# Patient Record
Sex: Male | Born: 1960
Health system: Southern US, Community
[De-identification: ages and names within clinical notes are randomized; demographics above are authoritative.]

## PROBLEM LIST (undated history)

## (undated) DIAGNOSIS — N400 Enlarged prostate without lower urinary tract symptoms: Secondary | ICD-10-CM

## (undated) DIAGNOSIS — E291 Testicular hypofunction: Secondary | ICD-10-CM

## (undated) DIAGNOSIS — Z87442 Personal history of urinary calculi: Secondary | ICD-10-CM

## (undated) DIAGNOSIS — R12 Heartburn: Secondary | ICD-10-CM

## (undated) DIAGNOSIS — F909 Attention-deficit hyperactivity disorder, unspecified type: Secondary | ICD-10-CM

## (undated) DIAGNOSIS — N480 Leukoplakia of penis: Secondary | ICD-10-CM

## (undated) DIAGNOSIS — F32A Depression, unspecified: Secondary | ICD-10-CM

## (undated) DIAGNOSIS — R7303 Prediabetes: Secondary | ICD-10-CM

## (undated) DIAGNOSIS — K219 Gastro-esophageal reflux disease without esophagitis: Secondary | ICD-10-CM

## (undated) DIAGNOSIS — F329 Major depressive disorder, single episode, unspecified: Secondary | ICD-10-CM

## (undated) DIAGNOSIS — N529 Male erectile dysfunction, unspecified: Secondary | ICD-10-CM

## (undated) DIAGNOSIS — F419 Anxiety disorder, unspecified: Secondary | ICD-10-CM

## (undated) HISTORY — DX: Heartburn: R12

## (undated) HISTORY — DX: Benign prostatic hyperplasia without lower urinary tract symptoms: N40.0

## (undated) HISTORY — DX: Depression, unspecified: F32.A

## (undated) HISTORY — PX: COLONOSCOPY: SHX174

## (undated) HISTORY — DX: Anxiety disorder, unspecified: F41.9

## (undated) HISTORY — PX: ESOPHAGOGASTRODUODENOSCOPY: SHX1529

## (undated) HISTORY — DX: Testicular hypofunction: E29.1

## (undated) HISTORY — PX: HERNIA REPAIR: SHX51

## (undated) HISTORY — DX: Major depressive disorder, single episode, unspecified: F32.9

## (undated) HISTORY — PX: CHOLECYSTECTOMY: SHX55

## (undated) HISTORY — DX: Leukoplakia of penis: N48.0

## (undated) HISTORY — DX: Male erectile dysfunction, unspecified: N52.9

## (undated) HISTORY — DX: Personal history of urinary calculi: Z87.442

---

## 2005-11-21 ENCOUNTER — Ambulatory Visit: Payer: Self-pay | Admitting: Urology

## 2006-04-15 ENCOUNTER — Ambulatory Visit: Payer: Self-pay | Admitting: Unknown Physician Specialty

## 2010-04-18 ENCOUNTER — Ambulatory Visit: Payer: Self-pay | Admitting: Internal Medicine

## 2010-04-23 ENCOUNTER — Ambulatory Visit: Payer: Self-pay | Admitting: Surgery

## 2010-05-14 ENCOUNTER — Ambulatory Visit: Payer: Self-pay | Admitting: Surgery

## 2010-05-15 LAB — PATHOLOGY REPORT

## 2011-10-14 ENCOUNTER — Other Ambulatory Visit: Payer: Self-pay | Admitting: Ophthalmology

## 2012-04-29 DIAGNOSIS — H169 Unspecified keratitis: Secondary | ICD-10-CM | POA: Insufficient documentation

## 2013-02-04 HISTORY — PX: CORNEAL TRANSPLANT: SHX108

## 2013-03-26 DIAGNOSIS — H179 Unspecified corneal scar and opacity: Secondary | ICD-10-CM | POA: Insufficient documentation

## 2015-05-16 ENCOUNTER — Other Ambulatory Visit: Payer: Self-pay | Admitting: Family Medicine

## 2015-12-13 DIAGNOSIS — F902 Attention-deficit hyperactivity disorder, combined type: Secondary | ICD-10-CM | POA: Diagnosis not present

## 2016-02-14 ENCOUNTER — Ambulatory Visit: Payer: Self-pay

## 2016-02-23 ENCOUNTER — Ambulatory Visit: Payer: Self-pay

## 2016-03-04 ENCOUNTER — Encounter: Payer: Self-pay | Admitting: *Deleted

## 2016-03-05 ENCOUNTER — Ambulatory Visit (INDEPENDENT_AMBULATORY_CARE_PROVIDER_SITE_OTHER): Payer: 59 | Admitting: Urology

## 2016-03-05 ENCOUNTER — Encounter: Payer: Self-pay | Admitting: Urology

## 2016-03-05 VITALS — BP 118/72 | HR 81 | Ht 76.0 in | Wt 196.7 lb

## 2016-03-05 DIAGNOSIS — N401 Enlarged prostate with lower urinary tract symptoms: Secondary | ICD-10-CM | POA: Diagnosis not present

## 2016-03-05 DIAGNOSIS — N529 Male erectile dysfunction, unspecified: Secondary | ICD-10-CM | POA: Insufficient documentation

## 2016-03-05 DIAGNOSIS — N528 Other male erectile dysfunction: Secondary | ICD-10-CM

## 2016-03-05 DIAGNOSIS — N400 Enlarged prostate without lower urinary tract symptoms: Secondary | ICD-10-CM | POA: Diagnosis not present

## 2016-03-05 DIAGNOSIS — N138 Other obstructive and reflux uropathy: Secondary | ICD-10-CM

## 2016-03-05 MED ORDER — TADALAFIL 5 MG PO TABS: 5.0000 mg | ORAL_TABLET | Freq: Every day | ORAL | Status: DC

## 2016-03-05 NOTE — Progress Notes (Signed)
03/05/2016 10:20 AM   Dustin Little [birth year] [id number]  Referring provider: No referring provider defined for this encounter.  Chief Complaint  Patient presents with  . Benign Prostatic Hypertrophy    1 year follow up    HPI: Patient is 55 year old Caucasian male with a history of ED and BPH with LUTS who presents today for his yearly follow up.    Erectile dysfunction His SHIM score is 19, which is mild ED.   He has been having difficulty with erections for over four years.   His major complaint is life stressors.  His libido is preserved.   His risk factors for ED are antidepressants, BPH, HLD and elevated HbgA1C .  He denies any painful erections or curvatures with his erections.   He has tried PDE5-inhibitors in the past with good success.          SHIM      03/05/16 1008       SHIM: Over the last 6 months:   How do you rate your confidence that you could get and keep an erection? Moderate     When you had erections with sexual stimulation, how often were your erections hard enough for penetration (entering your partner)? Most Times (much more than half the time)     During sexual intercourse, how often were you able to maintain your erection after you had penetrated (entered) your partner? Slightly Difficult     During sexual intercourse, how difficult was it to maintain your erection to completion of intercourse? Slightly Difficult     When you attempted sexual intercourse, how often was it satisfactory for you? Slightly Difficult     SHIM Total Score   SHIM 19        Score: 1-7 Severe ED 8-11 Moderate ED 12-16 Mild-Moderate ED 17-21 Mild ED 22-25 No ED   BPH WITH LUTS His IPSS score today is 1, which is mild lower urinary tract symptomatology. He is delighted with his quality life due to his urinary symptoms.  He denies any dysuria, hematuria or suprapubic pain.   He currently taking Cialis 5 mg daily.  He also denies any recent fevers, chills, nausea or  vomiting.  He does not have a family history of PCa.      IPSS      03/05/16 1000       International Prostate Symptom Score   How often have you had the sensation of not emptying your bladder? Not at All     How often have you had to urinate less than every two hours? Not at All     How often have you found you stopped and started again several times when you urinated? Not at All     How often have you found it difficult to postpone urination? Not at All     How often have you had a weak urinary stream? Not at All     How often have you had to strain to start urination? Not at All     How many times did you typically get up at night to urinate? 1 Time     Total IPSS Score 1     Quality of Life due to urinary symptoms   If you were to spend the rest of your life with your urinary condition just the way it is now how would you feel about that? Delighted        Score:  1-7 Mild  8-19 Moderate 20-35 Severe   PMH: Past Medical History  Diagnosis Date  . History of kidney stones   . Heartburn   . Anxiety   . Depression   . Hypogonadism in male   . ED (erectile dysfunction)   . BXO (balanitis xerotica obliterans)   . BPH (benign prostatic hypertrophy)     Surgical History: Past Surgical History  Procedure Laterality Date  . Corneal transplant  02/04/2013  . Hernia repair    . Cholecystectomy      Home Medications:    Medication List       This list is accurate as of: 03/05/16 10:20 AM.  Always use your most recent med list.               amphetamine-dextroamphetamine 15 MG 24 hr capsule  Commonly known as:  ADDERALL XR     buPROPion 300 MG 24 hr tablet  Commonly known as:  WELLBUTRIN XL  Take by mouth.     fluorometholone 0.1 % ophthalmic suspension  Commonly known as:  FML  Apply to eye.     tadalafil 5 MG tablet  Commonly known as:  CIALIS  Take 1 tablet (5 mg total) by mouth daily.        Allergies:  Allergies  Allergen Reactions  .  Orphenadrine Citrate Other (See Comments)    Blurred vision extremely    Family History: Family History  Problem Relation Age of Onset  . Cancer    . Kidney disease Neg Hx   . Prostate cancer Neg Hx     Social History:  reports that he has never smoked. He does not have any smokeless tobacco history on file. He reports that he drinks alcohol. He reports that he does not use illicit drugs.  ROS: UROLOGY Frequent Urination?: No Hard to postpone urination?: No Burning/pain with urination?: No Get up at night to urinate?: No Leakage of urine?: No Urine stream starts and stops?: No Trouble starting stream?: No Do you have to strain to urinate?: No Blood in urine?: No Urinary tract infection?: No Sexually transmitted disease?: No Injury to kidneys or bladder?: No Painful intercourse?: No Weak stream?: No Erection problems?: No Penile pain?: No  Gastrointestinal Nausea?: No Vomiting?: No Indigestion/heartburn?: Yes Diarrhea?: No Constipation?: No  Constitutional Fever: No Night sweats?: No Weight loss?: No Fatigue?: No  Skin Skin rash/lesions?: No Itching?: No  Eyes Blurred vision?: No Double vision?: No  Ears/Nose/Throat Sore throat?: No Sinus problems?: No  Hematologic/Lymphatic Swollen glands?: No Easy bruising?: No  Cardiovascular Leg swelling?: No Chest pain?: No  Respiratory Cough?: No Shortness of breath?: No  Endocrine Excessive thirst?: No  Musculoskeletal Back pain?: No Joint pain?: No  Neurological Headaches?: No Dizziness?: No  Psychologic Depression?: No Anxiety?: No  Physical Exam: BP 118/72 mmHg  Pulse 81  Ht 6\' 4"  (1.93 m)  Wt 196 lb 11.2 oz (89.223 kg)  BMI 23.95 kg/m2  Constitutional: Well nourished. Alert and oriented, No acute distress. HEENT: [place] AT, moist mucus membranes. Trachea midline, no masses. Cardiovascular: No clubbing, cyanosis, or edema. Respiratory: Normal respiratory effort, no increased work of  breathing. GI: Abdomen is soft, non tender, non distended, no abdominal masses. Liver and spleen not palpable.  No hernias appreciated.  Stool sample for occult testing is not indicated.   GU: No CVA tenderness.  No bladder fullness or masses.  Patient with circumcised phallus.  Urethral meatus is patent.  No penile discharge. No penile lesions or rashes.  Scrotum without lesions, cysts, rashes and/or edema.  Testicles are located scrotally bilaterally. No masses are appreciated in the testicles. Left and right epididymis are normal.  Left varicoceles are noted.   Rectal: Patient with  normal sphincter tone. Anus and perineum without scarring or rashes. No rectal masses are appreciated. Prostate is approximately 45 grams, no nodules are appreciated. Seminal vesicles are normal. Skin: No rashes, bruises or suspicious lesions. Lymph: No cervical or inguinal adenopathy. Neurologic: Grossly intact, no focal deficits, moving all 4 extremities. Psychiatric: Normal mood and affect.  Laboratory Data: PSA History  0.8 ng/mL on 02/10/2013  0.5 ng/mL on 02/10/2014  0.7 ng/mL on 02/14/2015   Assessment & Plan:    1. Erectile dysfunction:   SHIM score is 19.  Continue Cialis.  We will continue to monitor.  He will RTC in one year for SHIM score and exam.    2. BPH (benign prostatic hyperplasia) with LUTS:   IPSS score is 1/0.  He will continue the Cialis 5 mg daily.  He will have his IPSS score, exam and PSA in 12 months.   - PSA   Return in about 1 year (around 03/05/2017) for IPSS, SHIM and exam.  These notes generated with voice recognition software. I apologize for typographical errors.  Zara Council, [facility] [address] [place], [place] [address] [phone]

## 2016-03-06 LAB — PSA: Prostate Specific Ag, Serum: 0.5 ng/mL (ref 0.0–4.0)

## 2016-03-07 ENCOUNTER — Telehealth: Payer: Self-pay

## 2016-03-07 NOTE — Telephone Encounter (Signed)
Spoke with pt in reference to PSA results. Pt voiced understanding.  

## 2016-03-07 NOTE — Telephone Encounter (Signed)
-----   Message from Nori Riis, PA-C sent at 03/06/2016 12:57 PM EDT ----- Patient's PSA is stable.  We will see him in one year.

## 2016-04-25 DIAGNOSIS — F902 Attention-deficit hyperactivity disorder, combined type: Secondary | ICD-10-CM | POA: Diagnosis not present

## 2016-05-10 DIAGNOSIS — K219 Gastro-esophageal reflux disease without esophagitis: Secondary | ICD-10-CM | POA: Insufficient documentation

## 2016-05-10 DIAGNOSIS — Z8659 Personal history of other mental and behavioral disorders: Secondary | ICD-10-CM | POA: Insufficient documentation

## 2016-05-10 DIAGNOSIS — Z131 Encounter for screening for diabetes mellitus: Secondary | ICD-10-CM | POA: Diagnosis not present

## 2016-05-10 DIAGNOSIS — K449 Diaphragmatic hernia without obstruction or gangrene: Secondary | ICD-10-CM | POA: Diagnosis not present

## 2016-05-10 DIAGNOSIS — F325 Major depressive disorder, single episode, in full remission: Secondary | ICD-10-CM | POA: Diagnosis not present

## 2016-05-10 DIAGNOSIS — Z8639 Personal history of other endocrine, nutritional and metabolic disease: Secondary | ICD-10-CM | POA: Diagnosis not present

## 2016-05-10 DIAGNOSIS — Z1159 Encounter for screening for other viral diseases: Secondary | ICD-10-CM | POA: Diagnosis not present

## 2016-05-10 DIAGNOSIS — N529 Male erectile dysfunction, unspecified: Secondary | ICD-10-CM | POA: Diagnosis not present

## 2016-05-10 DIAGNOSIS — R972 Elevated prostate specific antigen [PSA]: Secondary | ICD-10-CM | POA: Diagnosis not present

## 2016-06-23 ENCOUNTER — Ambulatory Visit (INDEPENDENT_AMBULATORY_CARE_PROVIDER_SITE_OTHER): Payer: 59

## 2016-06-23 ENCOUNTER — Ambulatory Visit
Admission: EM | Admit: 2016-06-23 | Discharge: 2016-06-23 | Disposition: A | Discharge status: Home / Self Care | Payer: 59 | Attending: Family Medicine | Admitting: Family Medicine

## 2016-06-23 ENCOUNTER — Encounter: Payer: Self-pay | Admitting: Gynecology

## 2016-06-23 DIAGNOSIS — N200 Calculus of kidney: Secondary | ICD-10-CM

## 2016-06-23 DIAGNOSIS — R82998 Other abnormal findings in urine: Secondary | ICD-10-CM

## 2016-06-23 DIAGNOSIS — R8299 Other abnormal findings in urine: Secondary | ICD-10-CM

## 2016-06-23 DIAGNOSIS — R809 Proteinuria, unspecified: Secondary | ICD-10-CM

## 2016-06-23 DIAGNOSIS — R1031 Right lower quadrant pain: Secondary | ICD-10-CM | POA: Diagnosis not present

## 2016-06-23 LAB — CBC WITH DIFFERENTIAL/PLATELET
BASOS ABS: 0 10*3/uL (ref 0–0.1)
BASOS PCT: 1 %
EOS ABS: 0.4 10*3/uL (ref 0–0.7)
EOS PCT: 5 %
HCT: 44.1 % (ref 40.0–52.0)
Hemoglobin: 14.8 g/dL (ref 13.0–18.0)
Lymphocytes Relative: 19 %
Lymphs Abs: 1.8 10*3/uL (ref 1.0–3.6)
MCH: 30.6 pg (ref 26.0–34.0)
MCHC: 33.5 g/dL (ref 32.0–36.0)
MCV: 91.5 fL (ref 80.0–100.0)
Monocytes Absolute: 0.8 10*3/uL (ref 0.2–1.0)
Monocytes Relative: 8 %
NEUTROS PCT: 67 %
Neutro Abs: 6.3 10*3/uL (ref 1.4–6.5)
PLATELETS: 225 10*3/uL (ref 150–440)
RBC: 4.82 MIL/uL (ref 4.40–5.90)
RDW: 12.4 % (ref 11.5–14.5)
WBC: 9.3 10*3/uL (ref 3.8–10.6)

## 2016-06-23 LAB — COMPREHENSIVE METABOLIC PANEL
ALBUMIN: 4.7 g/dL (ref 3.5–5.0)
ALT: 17 U/L (ref 17–63)
AST: 17 U/L (ref 15–41)
Alkaline Phosphatase: 76 U/L (ref 38–126)
Anion gap: 7 (ref 5–15)
BUN: 15 mg/dL (ref 6–20)
CHLORIDE: 100 mmol/L — AB (ref 101–111)
CO2: 29 mmol/L (ref 22–32)
CREATININE: 1.03 mg/dL (ref 0.61–1.24)
Calcium: 9.1 mg/dL (ref 8.9–10.3)
GFR calc non Af Amer: >60 mL/min (ref >60)
GLUCOSE: 112 mg/dL — AB (ref 65–99)
Potassium: 3.6 mmol/L (ref 3.5–5.1)
SODIUM: 136 mmol/L (ref 135–145)
Total Bilirubin: 1 mg/dL (ref 0.3–1.2)
Total Protein: 7.6 g/dL (ref 6.5–8.1)

## 2016-06-23 LAB — URINALYSIS COMPLETE WITH MICROSCOPIC (ARMC ONLY)
Bilirubin Urine: NEGATIVE
Glucose, UA: NEGATIVE mg/dL
Ketones, ur: NEGATIVE mg/dL
LEUKOCYTES UA: NEGATIVE
Nitrite: NEGATIVE
PROTEIN: 30 mg/dL — AB
SPECIFIC GRAVITY, URINE: 1.015 (ref 1.005–1.030)
WBC, UA: NONE SEEN WBC/hpf (ref 0–5)
pH: 5.5 (ref 5.0–8.0)

## 2016-06-23 MED ORDER — OXYCODONE-ACETAMINOPHEN 5-325 MG PO TABS: 1.0000 | ORAL_TABLET | Freq: Four times a day (QID) | ORAL | 0 refills | Status: AC | PRN

## 2016-06-23 MED ORDER — ONDANSETRON 8 MG PO TBDP: 8.0000 mg | ORAL_TABLET | Freq: Two times a day (BID) | ORAL | 0 refills | Status: AC | PRN

## 2016-06-23 MED ORDER — KETOROLAC TROMETHAMINE 10 MG PO TABS: 10.0000 mg | ORAL_TABLET | Freq: Four times a day (QID) | ORAL | 0 refills | Status: AC | PRN

## 2016-06-23 MED ORDER — TAMSULOSIN HCL 0.4 MG PO CAPS: 0.4000 mg | ORAL_CAPSULE | Freq: Every day | ORAL | 0 refills | Status: AC

## 2016-06-23 MED ORDER — KETOROLAC TROMETHAMINE 60 MG/2ML IM SOLN
60.0000 mg | Freq: Once | INTRAMUSCULAR | Status: AC
  Administered 2016-06-23: 60 mg via INTRAMUSCULAR

## 2016-06-23 NOTE — ED Triage Notes (Signed)
Patient c/o sharp pain this morning at right lower quadrant and groin area.

## 2016-06-23 NOTE — ED Provider Notes (Signed)
CSN: [id number]     Arrival date & time 06/23/16  0945 History   First MD Initiated Contact with Patient 06/23/16 1005     Chief Complaint  Patient presents with  . Abdominal Pain   (Consider location/radiation/quality/duration/timing/severity/associated sxs/prior Treatment) Married caucasian male here with spouse as unable to urinate since yesterday.  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  Last kidney stone 15 years ago passed.  Saw Urology May 2016 for his BPH and ED.  Stated his urine has been darker this week and yesterday penis hurt with peeing or trying to pee.  Was awakened 0300 today with right inguinal lower abdomen pain nausea now resolved.  Ate light dinner last night egg noodles.  No breakfast this am.  Denied fevers, back pain.  PMHx  Hiatal hernia daily pain epigastric  PSHx right inguinal hernia repair tender today scar but not usually, cholecystectomy, corneal transplant   The history is provided by the patient and the spouse.    Past Medical History:  Diagnosis Date  . Anxiety   . BPH (benign prostatic hypertrophy)   . BXO (balanitis xerotica obliterans)   . Depression   . ED (erectile dysfunction)   . Heartburn   . History of kidney stones   . Hypogonadism in male    Past Surgical History:  Procedure Laterality Date  . CHOLECYSTECTOMY    . CORNEAL TRANSPLANT  02/04/2013  . HERNIA REPAIR     Family History  Problem Relation Age of Onset  . Cancer    . Kidney disease Neg Hx   . Prostate cancer Neg Hx    Social History  Substance Use Topics  . Smoking status: Never Smoker  . Smokeless tobacco: Never Used  . Alcohol use 0.0 oz/week     Comment: occ    Review of Systems  Constitutional: Positive for appetite change and diaphoresis. Negative for activity change, chills, fatigue and fever.  HENT: Negative for congestion, ear pain and sore throat.   Eyes: Negative for pain and visual disturbance.    Respiratory: Negative for cough and shortness of breath.   Cardiovascular: Negative for chest pain, palpitations and leg swelling.  Gastrointestinal: Positive for abdominal pain and nausea. Negative for abdominal distention, anal bleeding, blood in stool, constipation, diarrhea, rectal pain and vomiting.  Endocrine: Negative for cold intolerance and heat intolerance.  Genitourinary: Negative for dysuria and hematuria.  Musculoskeletal: Negative for arthralgias, back pain, gait problem, joint swelling, myalgias, neck pain and neck stiffness.  Skin: Negative for color change, pallor, rash and wound.  Allergic/Immunologic: Negative for environmental allergies and food allergies.  Neurological: Negative for dizziness, tremors, seizures, syncope, facial asymmetry, speech difficulty, weakness, light-headedness, numbness and headaches.  Hematological: Negative for adenopathy. Does not bruise/bleed easily.  Psychiatric/Behavioral: Positive for sleep disturbance.  All other systems reviewed and are negative.   Allergies  Orphenadrine citrate  Home Medications   Prior to Admission medications   Medication Sig Start Date End Date Taking? Authorizing Provider  amphetamine-dextroamphetamine (ADDERALL XR) 15 MG 24 hr capsule  01/31/16  Yes Historical Provider, MD  buPROPion (WELLBUTRIN XL) 300 MG 24 hr tablet Take by mouth.   Yes Historical Provider, MD  fluorometholone (FML) 0.1 % ophthalmic suspension Apply to eye. 04/19/15  Yes Historical Provider, MD  tadalafil (CIALIS) 5 MG tablet Take 1 tablet (5 mg total) by mouth daily. 03/05/16  Yes Shannon A McGowan, PA-C  ketorolac (TORADOL) 10 MG tablet Take  1 tablet (10 mg total) by mouth every 6 (six) hours as needed. 06/24/16 06/27/16  Olen Cordial, NP  ondansetron (ZOFRAN-ODT) 8 MG disintegrating tablet Take 1 tablet (8 mg total) by mouth 2 (two) times daily as needed for nausea or vomiting. 06/23/16 06/26/16  Olen Cordial, NP   oxyCODONE-acetaminophen (PERCOCET/ROXICET) 5-325 MG tablet Take 1 tablet by mouth every 6 (six) hours as needed for severe pain. 06/23/16 06/25/16  Olen Cordial, NP  tamsulosin (FLOMAX) 0.4 MG CAPS capsule Take 1 capsule (0.4 mg total) by mouth daily. 06/23/16 07/23/16  Olen Cordial, NP   Meds Ordered and Administered this Visit   Medications  ketorolac (TORADOL) injection 60 mg (60 mg Intramuscular Given 06/23/16 1122)    BP 109/70 (BP Location: Left Arm)   Pulse 60   Temp 97.7 F (36.5 C) (Oral)   Resp 16   Ht 6\' 4"  (1.93 m)   Wt 185 lb (83.9 kg)   SpO2 100%   BMI 22.52 kg/m  No data found.   Physical Exam  Constitutional: He is oriented to person, place, and time. He appears well-developed and well-nourished.  Non-toxic appearance. He does not have a sickly appearance. He does not appear ill. No distress.  HENT:  Head: Normocephalic and atraumatic.  Right Ear: Hearing, tympanic membrane, external ear and ear canal normal.  Left Ear: Hearing, tympanic membrane, external ear and ear canal normal.  Nose: Nose normal. No mucosal edema, rhinorrhea, nose lacerations, sinus tenderness, nasal deformity, septal deviation or nasal septal hematoma. No epistaxis.  No foreign bodies. Right sinus exhibits no maxillary sinus tenderness and no frontal sinus tenderness. Left sinus exhibits no maxillary sinus tenderness and no frontal sinus tenderness.  Mouth/Throat: Uvula is midline, oropharynx is clear and moist and mucous membranes are normal. No oropharyngeal exudate.  Eyes: Conjunctivae, EOM and lids are normal. Pupils are equal, round, and reactive to light. Right eye exhibits no discharge. Left eye exhibits no discharge. Right conjunctiva is not injected. Right conjunctiva has no hemorrhage. Left conjunctiva is not injected. Left conjunctiva has no hemorrhage. No scleral icterus.  Neck: Normal range of motion. Neck supple. No thyromegaly present.  Cardiovascular: Normal rate, regular  rhythm, normal heart sounds and intact distal pulses.   No murmur heard. Pulmonary/Chest: Effort normal and breath sounds normal. No respiratory distress. He has no wheezes.  Abdominal: Soft. He exhibits no shifting dullness, no distension, no pulsatile liver, no fluid wave, no abdominal bruit, no ascites, no pulsatile midline mass and no mass. Bowel sounds are decreased. There is no hepatosplenomegaly. There is tenderness in the epigastric area. There is no rigidity, no rebound, no guarding, no CVA tenderness, no tenderness at McBurney's point and negative Murphy's sign. No hernia. Hernia confirmed negative in the ventral area.    Hypoactive bowel sounds; tympanny to percussion RUQ; dull all other quads; patient points pain right inguinal but not TTP  Musculoskeletal: Normal range of motion. He exhibits no edema, tenderness or deformity.  Lymphadenopathy:    He has no cervical adenopathy.  Neurological: He is alert and oriented to person, place, and time. He displays normal reflexes. No cranial nerve deficit. He exhibits normal muscle tone. Coordination normal.  Skin: Skin is warm and dry. Capillary refill takes less than 2 seconds. No rash noted. No erythema. No pallor.  Psychiatric: He has a normal mood and affect. His behavior is normal. Judgment and thought content normal.  Nursing note and vitals reviewed.   Urgent Care Course  Clinical Course    Procedures (including critical care time)  Labs Review Labs Reviewed  URINALYSIS COMPLETEWITH MICROSCOPIC ([place] ONLY) - Abnormal; Notable for the following:       Result Value   APPearance HAZY (*)    Hgb urine dipstick LARGE (*)    Protein, ur 30 (*)    Bacteria, UA FEW (*)    Squamous Epithelial / LPF 0-5 (*)    All other components within normal limits  COMPREHENSIVE METABOLIC PANEL - Abnormal; Notable for the following:    Chloride 100 (*)    Glucose, Bld 112 (*)    All other components within normal limits  URINE CULTURE   CBC WITH DIFFERENTIAL/PLATELET    Imaging Review Dg Abdomen 1 View  Result Date: 06/23/2016 CLINICAL DATA:  Right lower quadrant pain beginning this morning. History of kidney stones. EXAM: ABDOMEN - 1 VIEW COMPARISON:  None. FINDINGS: Bowel gas pattern is normal without evidence of obstruction. Normal amount of fecal matter. Clips in the right upper quadrant consistent with previous cholecystectomy. Several 2-3 mm densities overlying the right kidney that could represent a small renal calculi. 3-4 mm calcification in the right pelvis is indeterminate for phlebolith versus distal ureteral stone. IMPRESSION: Gas pattern unremarkable. Previous cholecystectomy. Probable small renal calculi on the right. Indeterminate 3-4 mm calcification in the right pelvis could be a phlebolith or a distal ureteral stone. Electronically Signed   By: Nelson Chimes M.D.   On: 06/23/2016 10:42   1045 venipuncture x 1 for CBC/CMP by CMA Vanessa [place].  1100 KUB completed patient ambulatory to xray and back gait sure and steady  1105 Patient completed urine sample after drinking 354ml cold water in exam room.  Pain worsened since urination and po fluids.  BUN/Cr normal toradol 60mg  IM x 1 ordered 1118 along with orthostatics after 15 minutes.  KUB probable right kidney stone given copy of radiology report and disk requested from radiology tech [place] for patient.  Discussed with patient to continue hydration.  Plan of care as previously discussed with Flomax, strain urine, zofran po prn and follow up with urology if stone not passed.  ER if unable to urinate every 8 hours, gross hematuria, worsening pain, fever.  Patient and spouse verbalized understanding information/instructions, agreed with plan of care and had no further questions at this time.  1122 toradol 60mg  IM administered by [place]  1149 urinalysis, CMP and CBC results discussed with patient and spouse and given copy of results. + calcium oxalate  crystals, protein, RBCs, rare bacteria in urine CMP and CBC essentially normal  Patient requested toradol for home use.  Discussed max 10mg  po QID/40mg  per 24 hours and 4 days use to start tomorrow due to injection today.  Patient and spouse verbalized understanding information/instructions, agreed with plan of care and had no further questions at this time.  Orthostatic VS for the past 24 hrs:  BP- Lying Pulse- Lying BP- Sitting Pulse- Sitting BP- Standing at 0 minutes Pulse- Standing at 0 minutes  06/23/16 1149 125/80 55 120/82 60 112/77 60      MDM   1. Nephrolithiasis   2. Proteinuria   3. Calcium oxalate crystals in urine    Discussed KUB results and serum lab results with patient and spouse and given copies of reports.  Urology apt to be scheduled if no stone passed in next 3 days.  Strain all urine.  Hydrate, hydrate.   Flomax 0.4mg  daily as directed.  zofran 8mg   ODT po q12h prn nausea/vomiting. Patient is also to push fluids and strain urine. Call or return to clinic as needed if these symptoms worsen or fail to improve as anticipated e.g. gross hematuria, fever, worsening pain, unable to void.  Work excuse x 72 hours given to patient.  Exitcare handout on nephrolithiasis, microscopic hematuria given to patient.  Patient and spouse verbalized agreement and understanding of treatment plan and had no further questions at this time. P2:  Hydrate, post coital urination, and cranberry juice    Olen Cordial, NP 06/23/16 1224

## 2016-06-25 LAB — URINE CULTURE
CULTURE: NO GROWTH
Special Requests: NORMAL

## 2016-06-26 DIAGNOSIS — H179 Unspecified corneal scar and opacity: Secondary | ICD-10-CM | POA: Diagnosis not present

## 2016-06-27 ENCOUNTER — Telehealth: Payer: Self-pay | Admitting: General Practice

## 2016-06-27 NOTE — Telephone Encounter (Signed)
Spoke with patient via telephone notified urine culture normal/negative.  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  Patient to hydrate to avoid dehydration follow kidney stone prevention diet/low purine and follow up with urology.  Patient verbalized understanding information/instructions/agreed with plan of care and had no further questions at this time.

## 2016-08-21 DIAGNOSIS — F902 Attention-deficit hyperactivity disorder, combined type: Secondary | ICD-10-CM | POA: Diagnosis not present

## 2016-09-05 ENCOUNTER — Telehealth: Payer: Self-pay

## 2016-09-05 ENCOUNTER — Ambulatory Visit (INDEPENDENT_AMBULATORY_CARE_PROVIDER_SITE_OTHER): Payer: 59

## 2016-09-05 ENCOUNTER — Other Ambulatory Visit: Payer: Self-pay | Admitting: Urology

## 2016-09-05 VITALS — BP 146/82 | HR 96 | Ht 76.0 in | Wt 196.3 lb

## 2016-09-05 DIAGNOSIS — R109 Unspecified abdominal pain: Secondary | ICD-10-CM

## 2016-09-05 DIAGNOSIS — R300 Dysuria: Secondary | ICD-10-CM

## 2016-09-05 LAB — URINALYSIS, COMPLETE
BILIRUBIN UA: NEGATIVE
Glucose, UA: NEGATIVE
KETONES UA: NEGATIVE
NITRITE UA: POSITIVE — AB
Protein, UA: NEGATIVE
SPEC GRAV UA: 1.02 (ref 1.005–1.030)
UUROB: 0.2 mg/dL (ref 0.2–1.0)
pH, UA: 5.5 (ref 5.0–7.5)

## 2016-09-05 LAB — MICROSCOPIC EXAMINATION: EPITHELIAL CELLS (NON RENAL): NONE SEEN /HPF (ref 0–10)

## 2016-09-05 MED ORDER — SULFAMETHOXAZOLE-TRIMETHOPRIM 800-160 MG PO TABS: 1.0000 | ORAL_TABLET | Freq: Two times a day (BID) | ORAL | 0 refills | Status: DC

## 2016-09-05 NOTE — Telephone Encounter (Signed)
Per Larene Beach an abx was sent to pharmacy and CT scan ordered. Made pt aware. Pt voiced understanding.

## 2016-09-05 NOTE — Progress Notes (Signed)
Pt presented today with c/o urinary frequency, hard to postpone urination, dysuria, and lower abd pain. Pt does have a history of kidney stones, which he is unaware if hes passed. Pt provided a clean catch specimen for u/a and cx.    Blood pressure (!) 146/82, pulse 96, height 6\' 4"  (1.93 m), weight 196 lb 4.8 oz (89 kg).

## 2016-09-05 NOTE — Progress Notes (Signed)
Patient was seen at urgent care, TNTC RBC with negative culture, KUB may have seen distal right calculus.  Septra ordered and CT scan ordered.

## 2016-09-08 ENCOUNTER — Other Ambulatory Visit: Payer: Self-pay | Admitting: Urology

## 2016-09-08 LAB — CULTURE, URINE COMPREHENSIVE

## 2016-09-08 MED ORDER — AMOXICILLIN-POT CLAVULANATE 875-125 MG PO TABS: 1.0000 | ORAL_TABLET | Freq: Two times a day (BID) | ORAL | 0 refills | Status: DC

## 2016-09-09 ENCOUNTER — Telehealth: Payer: Self-pay

## 2016-09-09 NOTE — Telephone Encounter (Signed)
-----   Message from Nori Riis, PA-C sent at 09/08/2016  7:54 PM EST ----- Patient has a +UCx.  They need to start Augementin 875/125,  one tablet twice daily for seven days.  They also need to take a probiotic with the antibiotic course.  The dosage is listed below:  L. acidophilus and L. casei (25 x 109 CFU/day for 2 days, then 50 x 109 CFU/day for duration of the antibiotic course)  I have e scribed their prescription to [facility] in [place].

## 2016-09-09 NOTE — Telephone Encounter (Signed)
Spoke with pt in reference to augmentin. Pt stated that he picked up medication over the weekend.

## 2016-09-09 NOTE — Telephone Encounter (Signed)
Opened in error

## 2017-02-01 ENCOUNTER — Telehealth: Payer: 59 | Admitting: Family

## 2017-02-01 DIAGNOSIS — J028 Acute pharyngitis due to other specified organisms: Secondary | ICD-10-CM

## 2017-02-01 DIAGNOSIS — B9689 Other specified bacterial agents as the cause of diseases classified elsewhere: Secondary | ICD-10-CM

## 2017-02-01 MED ORDER — AZITHROMYCIN 250 MG PO TABS
ORAL_TABLET | ORAL | 0 refills | Status: DC
Start: 2017-02-01 — End: 2017-03-10

## 2017-02-01 MED ORDER — PREDNISONE 5 MG PO TABS: 5.0000 mg | ORAL_TABLET | ORAL | 0 refills | Status: DC

## 2017-02-01 MED ORDER — BENZONATATE 100 MG PO CAPS: 100.0000 mg | ORAL_CAPSULE | Freq: Three times a day (TID) | ORAL | 0 refills | Status: DC | PRN

## 2017-02-01 NOTE — Progress Notes (Signed)
We are sorry that you are not feeling well.  Here is how we plan to help!  Based on what you have shared with me it looks like you have upper respiratory tract inflammation that has resulted in a significant cough.  Inflammation and infection in the upper respiratory tract is commonly called bronchitis and has four common causes:  Allergies, Viral Infections, Acid Reflux and Bacterial Infections.  Allergies, viruses and acid reflux are treated by controlling symptoms or eliminating the cause. An example might be a cough caused by taking certain blood pressure medications. You stop the cough by changing the medication. Another example might be a cough caused by acid reflux. Controlling the reflux helps control the cough.  Based on your presentation I believe you most likely have A cough due to bacteria.  When patients have a fever and a productive cough with a change in color or increased sputum production, we are concerned about bacterial bronchitis.  If left untreated it can progress to pneumonia.  If your symptoms do not improve with your treatment plan it is important that you contact your provider.   I have prescribed Azithromyin 250 mg: two tables now and then one tablet daily for 4 additonal days    In addition you may use A non-prescription cough medication called Mucinex DM: take 2 tablets every 12 hours. and A prescription cough medication called Tessalon Perles 100mg . You may take 1-2 capsules every 8 hours as needed for your cough.  Sterapred 5 mg dosepak  USE OF BRONCHODILATOR ("RESCUE") INHALERS: There is a risk from using your bronchodilator too frequently.  The risk is that over-reliance on a medication which only relaxes the muscles surrounding the breathing tubes can reduce the effectiveness of medications prescribed to reduce swelling and congestion of the tubes themselves.  Although you feel brief relief from the bronchodilator inhaler, your asthma may actually be worsening with the  tubes becoming more swollen and filled with mucus.  This can delay other crucial treatments, such as oral steroid medications. If you need  to use a bronchodilator inhaler daily, several times per day, you should discuss this with your provider.  There are probably better treatments that could be used to keep your asthma under control.     HOME CARE . Only take medications as instructed by your medical team. . Complete the entire course of an antibiotic. . Drink plenty of fluids and get plenty of rest. . Avoid close contacts especially the very young and the elderly . Cover your mouth if you cough or cough into your sleeve. . Always remember to wash your hands . A steam or ultrasonic humidifier can help congestion.   GET HELP RIGHT AWAY IF: . You develop worsening fever. . You become short of breath . You cough up blood. . Your symptoms persist after you have completed your treatment plan MAKE SURE YOU   Understand these instructions.  Will watch your condition.  Will get help right away if you are not doing well or get worse.  Your e-visit answers were reviewed by a board certified advanced clinical practitioner to complete your personal care plan.  Depending on the condition, your plan could have included both over the counter or prescription medications. If there is a problem please reply  once you have received a response from your provider. Your safety is important to [place].  If you have drug allergies check your prescription carefully.    You can use MyChart to ask questions about  today's visit, request a non-urgent call back, or ask for a work or school excuse for 24 hours related to this e-Visit. If it has been greater than 24 hours you will need to follow up with your provider, or enter a new e-Visit to address those concerns. You will get an e-mail in the next two days asking about your experience.  I hope that your e-visit has been valuable and will speed your recovery. Thank  you for using e-visits.

## 2017-02-18 DIAGNOSIS — F902 Attention-deficit hyperactivity disorder, combined type: Secondary | ICD-10-CM | POA: Diagnosis not present

## 2017-03-03 ENCOUNTER — Other Ambulatory Visit: Payer: Self-pay

## 2017-03-03 ENCOUNTER — Other Ambulatory Visit: Payer: 59

## 2017-03-03 DIAGNOSIS — N401 Enlarged prostate with lower urinary tract symptoms: Secondary | ICD-10-CM

## 2017-03-04 NOTE — Progress Notes (Deleted)
03/05/2017 9:43 PM   Dustin Little May 27, 1961 [id number]  Referring provider: No referring provider defined for this encounter.  No chief complaint on file.   HPI: Patient is 56 year old Caucasian male with  ED and BPH with LUTS who presents today for his yearly follow up.    Erectile dysfunction His SHIM score is ***, which is mild ED.   His previous SHIM score was 19.  He has been having difficulty with erections for over five years.   His major complaint is life stressors.  His libido is preserved.   His risk factors for ED are antidepressants, BPH, HLD and elevated HbgA1C .  He denies any painful erections or curvatures with his erections.   He has tried PDE5-inhibitors in the past with good success.        Score: 1-7 Severe ED 8-11 Moderate ED 12-16 Mild-Moderate ED 17-21 Mild ED 22-25 No ED   BPH WITH LUTS His IPSS score today is ***, which is *** lower urinary tract symptomatology. He is *** with his quality life due to his urinary symptoms.  His previous I PSS score was 1/0.  He denies any dysuria, hematuria or suprapubic pain.   He currently taking Cialis 5 mg daily.  He also denies any recent fevers, chills, nausea or vomiting.  He does not have a family history of PCa.    Score:  1-7 Mild 8-19 Moderate 20-35 Severe   PMH: Past Medical History:  Diagnosis Date  . Anxiety   . BPH (benign prostatic hypertrophy)   . BXO (balanitis xerotica obliterans)   . Depression   . ED (erectile dysfunction)   . Heartburn   . History of kidney stones   . Hypogonadism in male     Surgical History: Past Surgical History:  Procedure Laterality Date  . CHOLECYSTECTOMY    . CORNEAL TRANSPLANT  02/04/2013  . HERNIA REPAIR      Home Medications:  Allergies as of 03/05/2017      Reactions   Orphenadrine Citrate Other (See Comments)   Blurred vision extremely      Medication List       Accurate as of 03/04/17  9:43 PM. Always use your most recent med list.          amoxicillin-clavulanate 875-125 MG tablet Commonly known as:  AUGMENTIN Take 1 tablet by mouth every 12 (twelve) hours.   amphetamine-dextroamphetamine 15 MG 24 hr capsule Commonly known as:  ADDERALL XR   azithromycin 250 MG tablet Commonly known as:  ZITHROMAX Take two tablets now, then one tablet daily times four days.   benzonatate 100 MG capsule Commonly known as:  TESSALON PERLES Take 1-2 capsules (100-200 mg total) by mouth every 8 (eight) hours as needed for cough.   buPROPion 300 MG 24 hr tablet Commonly known as:  WELLBUTRIN XL Take by mouth.   fluorometholone 0.1 % ophthalmic suspension Commonly known as:  FML Apply to eye.   predniSONE 5 MG tablet Commonly known as:  DELTASONE Take 1 tablet (5 mg total) by mouth as directed. 21 dose taper per pharmacy   sulfamethoxazole-trimethoprim 800-160 MG tablet Commonly known as:  BACTRIM DS,SEPTRA DS Take 1 tablet by mouth every 12 (twelve) hours.   tadalafil 5 MG tablet Commonly known as:  CIALIS Take 1 tablet (5 mg total) by mouth daily.       Allergies:  Allergies  Allergen Reactions  . Orphenadrine Citrate Other (See Comments)  Blurred vision extremely    Family History: Family History  Problem Relation Age of Onset  . Cancer    . Kidney disease Neg Hx   . Prostate cancer Neg Hx     Social History:  reports that he has never smoked. He has never used smokeless tobacco. He reports that he drinks alcohol. He reports that he does not use drugs.  ROS:                                        Physical Exam: There were no vitals taken for this visit.  Constitutional: Well nourished. Alert and oriented, No acute distress. HEENT: [place] AT, moist mucus membranes. Trachea midline, no masses. Cardiovascular: No clubbing, cyanosis, or edema. Respiratory: Normal respiratory effort, no increased work of breathing. GI: Abdomen is soft, non tender, non distended, no abdominal  masses. Liver and spleen not palpable.  No hernias appreciated.  Stool sample for occult testing is not indicated.   GU: No CVA tenderness.  No bladder fullness or masses.  Patient with circumcised phallus.  Urethral meatus is patent.  No penile discharge. No penile lesions or rashes. Scrotum without lesions, cysts, rashes and/or edema.  Testicles are located scrotally bilaterally. No masses are appreciated in the testicles. Left and right epididymis are normal.  Left varicoceles are noted.   Rectal: Patient with  normal sphincter tone. Anus and perineum without scarring or rashes. No rectal masses are appreciated. Prostate is approximately 45 grams, no nodules are appreciated. Seminal vesicles are normal. Skin: No rashes, bruises or suspicious lesions. Lymph: No cervical or inguinal adenopathy. Neurologic: Grossly intact, no focal deficits, moving all 4 extremities. Psychiatric: Normal mood and affect.  Laboratory Data: PSA History  0.8 ng/mL on 02/10/2013  0.5 ng/mL on 02/10/2014  0.7 ng/mL on 02/14/2015  0.5 ng/mL on 03/05/2016  Assessment & Plan:    1. Erectile dysfunction  - SHIM score is ***, it is stable, worsening, improving  - I explained to the patient that in order to achieve an erection it takes good functioning of the nervous system (parasympathetic, sympathetic, sensory and motor), good blood flow into the erectile tissue of the penis and a desire to have sex  - I explained that conditions like diabetes, hypertension, coronary artery disease, peripheral vascular disease, smoking, alcohol consumption, age, sleep apnea and BPH can diminish the ability to have an erection  - A recent study published in Sex Med 2018 Apr 13 revealed moderate to vigorous aerobic exercise for 40 minutes 4 times per week can decrease erectile problems caused by physical inactivity, obesity, hypertension, metabolic syndrome and/or cardiovascular diseases  - We discussed trying a *** different PDE5  inhibitor, intra-urethral suppositories, intracavernous vasoactive drug injection therapy, vacuum constriction device and penile prosthesis implantation  - He would like to try ***  - Continue Cialis, Viagra, Levitra, Stendra  - RTC in *** months for repeat SHIM score and exam    2. BPH with LUTS  - IPSS score is ***, it is stable/improving/worsening  - Continue conservative management, avoiding bladder irritants and timed voiding's  - most bothersome symptoms is/are ***  - Initiate alpha-blocker (***), discussed side effects ***  - Initiate 5 alpha reductase inhibitor (***), discussed side effects ***  - Continue tamsulosin 0.4 mg daily, alfuzosin 10 mg daily, Rapaflo 8 mg daily, terazosin, doxazosin, Cialis 5 mg daily and finasteride 5 mg daily,  dutasteride 0.5 mg daily***:refills given  - Cannot tolerate medication or medication failure, schedule cystoscopy ***  - RTC in *** months for IPSS, PSA, PVR and exam         No Follow-up on file.  These notes generated with voice recognition software. I apologize for typographical errors.  Zara Council, [facility] [address], [place], [place] [address] [phone]

## 2017-03-05 ENCOUNTER — Ambulatory Visit: Payer: 59 | Admitting: Urology

## 2017-03-07 NOTE — Progress Notes (Signed)
03/10/2017 4:31 PM   Dustin Little [birth year] [id number]  Referring provider: Glendon Axe, MD [facility] [place], [place] [address]  Chief Complaint  Patient presents with  . Follow-up    BPH    HPI: Patient is 56 year old Caucasian male with  ED and BPH with LUTS who presents today for his yearly follow up.    Erectile dysfunction His SHIM score is 19, which is mild ED.   His previous SHIM score was 19.  He has been having difficulty with erections for over five years.   His major complaint is life stressors.  His libido is preserved.   His risk factors for ED are antidepressants, BPH, HLD and elevated HbgA1C .  He denies any painful erections or curvatures with his erections.   He has tried PDE5-inhibitors in the past with good success.       SHIM    Row Name 03/10/17 1606         SHIM: Over the last 6 months:   How do you rate your confidence that you could get and keep an erection? Moderate     When you had erections with sexual stimulation, how often were your erections hard enough for penetration (entering your partner)? Most Times (much more than half the time)     During sexual intercourse, how often were you able to maintain your erection after you had penetrated (entered) your partner? Most Times (much more than half the time)     During sexual intercourse, how difficult was it to maintain your erection to completion of intercourse? Slightly Difficult     When you attempted sexual intercourse, how often was it satisfactory for you? Most Times (much more than half the time)       SHIM Total Score   SHIM 19        Score: 1-7 Severe ED 8-11 Moderate ED 12-16 Mild-Moderate ED 17-21 Mild ED 22-25 No ED   BPH WITH LUTS His IPSS score today is 4, which is mild lower urinary tract symptomatology. He is pleased with his quality life due to his urinary symptoms.  His previous I PSS score was 1/0.  He denies any dysuria, hematuria or  suprapubic pain.   He currently taking Cialis 5 mg daily.  He also denies any recent fevers, chills, nausea or vomiting.  He does not have a family history of PCa.     IPSS    Row Name 03/10/17 1600         International Prostate Symptom Score   How often have you had the sensation of not emptying your bladder? Not at All     How often have you had to urinate less than every two hours? About half the time     How often have you found you stopped and started again several times when you urinated? Not at All     How often have you found it difficult to postpone urination? Not at All     How often have you had a weak urinary stream? Not at All     How often have you had to strain to start urination? Not at All     How many times did you typically get up at night to urinate? 1 Time     Total IPSS Score 4       Quality of Life due to urinary symptoms   If you were to spend the rest  of your life with your urinary condition just the way it is now how would you feel about that? Pleased        Score:  1-7 Mild 8-19 Moderate 20-35 Severe   PMH: Past Medical History:  Diagnosis Date  . Anxiety   . BPH (benign prostatic hypertrophy)   . BXO (balanitis xerotica obliterans)   . Depression   . ED (erectile dysfunction)   . Heartburn   . History of kidney stones   . Hypogonadism in male     Surgical History: Past Surgical History:  Procedure Laterality Date  . CHOLECYSTECTOMY    . CORNEAL TRANSPLANT  02/04/2013  . HERNIA REPAIR      Home Medications:  Allergies as of 03/10/2017      Reactions   Orphenadrine Citrate Other (See Comments)   Blurred vision extremely      Medication List       Accurate as of 03/10/17  4:31 PM. Always use your most recent med list.          amphetamine-dextroamphetamine 15 MG 24 hr capsule Commonly known as:  ADDERALL XR   buPROPion 300 MG 24 hr tablet Commonly known as:  WELLBUTRIN XL Take by mouth.   fluorometholone 0.1 % ophthalmic  suspension Commonly known as:  FML Apply to eye.   tadalafil 5 MG tablet Commonly known as:  CIALIS Take 1 tablet (5 mg total) by mouth daily as needed for erectile dysfunction.       Allergies:  Allergies  Allergen Reactions  . Orphenadrine Citrate Other (See Comments)    Blurred vision extremely    Family History: Family History  Problem Relation Age of Onset  . Cancer    . Kidney disease Neg Hx   . Prostate cancer Neg Hx     Social History:  reports that he has never smoked. He has never used smokeless tobacco. He reports that he drinks alcohol. He reports that he does not use drugs.  ROS: UROLOGY Frequent Urination?: No Hard to postpone urination?: No Burning/pain with urination?: No Get up at night to urinate?: No Leakage of urine?: No Urine stream starts and stops?: No Trouble starting stream?: No Do you have to strain to urinate?: No Blood in urine?: No Urinary tract infection?: No Sexually transmitted disease?: No Injury to kidneys or bladder?: No Painful intercourse?: No Weak stream?: No Erection problems?: Yes Penile pain?: No  Gastrointestinal Nausea?: No Vomiting?: No Indigestion/heartburn?: No Diarrhea?: No Constipation?: No  Constitutional Fever: No Night sweats?: No Weight loss?: No Fatigue?: No  Skin Skin rash/lesions?: No Itching?: No  Eyes Blurred vision?: No Double vision?: No  Ears/Nose/Throat Sore throat?: No Sinus problems?: No  Hematologic/Lymphatic Swollen glands?: No Easy bruising?: No  Cardiovascular Leg swelling?: No Chest pain?: No  Respiratory Cough?: No Shortness of breath?: No  Endocrine Excessive thirst?: No  Musculoskeletal Back pain?: Yes Joint pain?: Yes  Neurological Headaches?: No Dizziness?: No  Psychologic Depression?: Yes Anxiety?: Yes  Physical Exam: BP 116/76   Pulse 97   Ht 6\' 4"  (1.93 m)   Wt 209 lb (94.8 kg)   BMI 25.44 kg/m   Constitutional: Well nourished. Alert  and oriented, No acute distress. HEENT: [place] AT, moist mucus membranes. Trachea midline, no masses. Cardiovascular: No clubbing, cyanosis, or edema. Respiratory: Normal respiratory effort, no increased work of breathing. GI: Abdomen is soft, non tender, non distended, no abdominal masses. Liver and spleen not palpable.  No hernias appreciated.  Stool sample for  occult testing is not indicated.   GU: No CVA tenderness.  No bladder fullness or masses.  Patient with circumcised phallus.  Urethral meatus is patent.  No penile discharge. No penile lesions or rashes. Scrotum without lesions, cysts, rashes and/or edema.  Testicles are located scrotally bilaterally. No masses are appreciated in the testicles. Left and right epididymis are normal.  Left varicoceles are noted.   Rectal: Patient with  normal sphincter tone. Anus and perineum without scarring or rashes. No rectal masses are appreciated. Prostate is approximately 45 grams, no nodules are appreciated. Seminal vesicles are normal. Skin: No rashes, bruises or suspicious lesions. Lymph: No cervical or inguinal adenopathy. Neurologic: Grossly intact, no focal deficits, moving all 4 extremities. Psychiatric: Normal mood and affect.  Laboratory Data: PSA History  0.8 ng/mL on 02/10/2013  0.5 ng/mL on 02/10/2014  0.7 ng/mL on 02/14/2015  0.5 ng/mL on 03/05/2016  Assessment & Plan:    1. Erectile dysfunction  - SHIM score is 19, it is stable  - I explained to the patient that in order to achieve an erection it takes good functioning of the nervous system (parasympathetic, sympathetic, sensory and motor), good blood flow into the erectile tissue of the penis and a desire to have sex  - I explained that conditions like diabetes, hypertension, coronary artery disease, peripheral vascular disease, smoking, alcohol consumption, age, sleep apnea and BPH can diminish the ability to have an erection  - A recent study published in Sex Med 2018 Apr 13  revealed moderate to vigorous aerobic exercise for 40 minutes 4 times per week can decrease erectile problems caused by physical inactivity, obesity, hypertension, metabolic syndrome and/or cardiovascular diseases  - Continue Cialis  - RTC in 12 months for repeat SHIM score and exam    2. BPH with LUTS  - IPSS score is 4/1, it is worsening  - Continue conservative management, avoiding bladder irritants and timed voiding's  - most bothersome symptoms is/are frequency  - Continue Cialis 5 mg daily :refills given  - RTC in12 months for IPSS, PSA and exam   Return in about 1 year (around 03/10/2018) for IPSS, SHIM, PSA and exam.  These notes generated with voice recognition software. I apologize for typographical errors.  Zara Council, [facility] [address], [place], [place] [address] [phone]

## 2017-03-10 ENCOUNTER — Ambulatory Visit (INDEPENDENT_AMBULATORY_CARE_PROVIDER_SITE_OTHER): Payer: 59 | Admitting: Urology

## 2017-03-10 ENCOUNTER — Encounter: Payer: Self-pay | Admitting: Urology

## 2017-03-10 VITALS — BP 116/76 | HR 97 | Ht 76.0 in | Wt 209.0 lb

## 2017-03-10 DIAGNOSIS — N529 Male erectile dysfunction, unspecified: Secondary | ICD-10-CM | POA: Diagnosis not present

## 2017-03-10 DIAGNOSIS — N138 Other obstructive and reflux uropathy: Secondary | ICD-10-CM | POA: Diagnosis not present

## 2017-03-10 DIAGNOSIS — N401 Enlarged prostate with lower urinary tract symptoms: Secondary | ICD-10-CM | POA: Diagnosis not present

## 2017-03-10 MED ORDER — TADALAFIL 5 MG PO TABS: 5.0000 mg | ORAL_TABLET | Freq: Every day | ORAL | 3 refills | Status: DC | PRN

## 2017-03-11 ENCOUNTER — Telehealth: Payer: Self-pay

## 2017-03-11 LAB — PSA: PROSTATE SPECIFIC AG, SERUM: 0.6 ng/mL (ref 0.0–4.0)

## 2017-03-11 NOTE — Telephone Encounter (Signed)
-----   Message from Nori Riis, PA-C sent at 03/11/2017  7:56 AM EDT ----- Patient's PSA is stable at 0.6.   We will see him in 12 months.  PSA to be drawn before his next appointment.

## 2017-03-11 NOTE — Telephone Encounter (Signed)
LMOM- labs stable. f/u in 22yr

## 2017-03-20 ENCOUNTER — Telehealth: Payer: Self-pay | Admitting: Urology

## 2017-03-20 DIAGNOSIS — N529 Male erectile dysfunction, unspecified: Secondary | ICD-10-CM

## 2017-03-20 NOTE — Telephone Encounter (Signed)
Pt called and needs pre-authorization for Cialis.  Please give pt a call [phone]

## 2017-03-21 MED ORDER — SILDENAFIL CITRATE 20 MG PO TABS: ORAL_TABLET | ORAL | 0 refills | Status: DC

## 2017-03-21 NOTE — Telephone Encounter (Signed)
Pt cialis wad denied. Pt requested sildenafil. Per Larene Beach medication was sent to [facility]. Pt voiced understanding.

## 2017-04-18 ENCOUNTER — Telehealth: Payer: Self-pay | Admitting: Urology

## 2017-04-18 DIAGNOSIS — N529 Male erectile dysfunction, unspecified: Secondary | ICD-10-CM

## 2017-04-18 NOTE — Telephone Encounter (Signed)
Spoke with patient and let him know that I would consult with Larene Beach on Monday morning because he has already used the 90 pills and this medication was just called in on 03/21/2017.

## 2017-04-18 NOTE — Telephone Encounter (Signed)
Pt called in and insurance doesn't cover Cialis.  Triage nurse prescribed generic Viagra.  He would like a refill called in to [facility].  Please give pt a call [phone].

## 2017-04-21 ENCOUNTER — Telehealth: Payer: Self-pay | Admitting: *Deleted

## 2017-04-21 DIAGNOSIS — N529 Male erectile dysfunction, unspecified: Secondary | ICD-10-CM

## 2017-04-21 MED ORDER — SILDENAFIL CITRATE 20 MG PO TABS: ORAL_TABLET | ORAL | 0 refills | Status: DC

## 2017-04-21 NOTE — Telephone Encounter (Signed)
Patient called the office again this morning.  He is requesting a refill on sildenafill.  A prescription was called into the [facility] on 03/21/2017 for 90 tablets.  The instructions say to take 2-5 tablets prior to intercourse.    Patient confirmed that he is taking 4-5 tablets prior to intercourse and is out of the medication.    Please advise.

## 2017-04-21 NOTE — Addendum Note (Signed)
Addended by: Orlene Erm on: 04/21/2017 03:43 PM   Modules accepted: Orders

## 2017-04-21 NOTE — Telephone Encounter (Signed)
Okay to refill? 

## 2017-04-21 NOTE — Telephone Encounter (Signed)
Went to wrong pharmacy fixed and cancelled rx at [facility].

## 2017-04-21 NOTE — Telephone Encounter (Signed)
How many tablets is he taking at one time?  Also call the pharmacy and confirm that he received 90 tablets.  They may not have been able to fill the entire prescription.

## 2017-04-21 NOTE — Telephone Encounter (Signed)
Medication refilled. Patient notified.

## 2017-08-19 DIAGNOSIS — F902 Attention-deficit hyperactivity disorder, combined type: Secondary | ICD-10-CM | POA: Diagnosis not present

## 2017-10-30 DIAGNOSIS — H04123 Dry eye syndrome of bilateral lacrimal glands: Secondary | ICD-10-CM | POA: Diagnosis not present

## 2017-10-30 DIAGNOSIS — Z947 Corneal transplant status: Secondary | ICD-10-CM | POA: Diagnosis not present

## 2017-10-30 DIAGNOSIS — H524 Presbyopia: Secondary | ICD-10-CM | POA: Diagnosis not present

## 2018-02-11 DIAGNOSIS — F902 Attention-deficit hyperactivity disorder, combined type: Secondary | ICD-10-CM | POA: Diagnosis not present

## 2018-03-10 ENCOUNTER — Telehealth: Payer: 59 | Admitting: Physician Assistant

## 2018-03-10 ENCOUNTER — Ambulatory Visit: Payer: 59 | Admitting: Urology

## 2018-03-10 DIAGNOSIS — J019 Acute sinusitis, unspecified: Secondary | ICD-10-CM

## 2018-03-10 DIAGNOSIS — B9689 Other specified bacterial agents as the cause of diseases classified elsewhere: Secondary | ICD-10-CM | POA: Diagnosis not present

## 2018-03-10 MED ORDER — AMOXICILLIN-POT CLAVULANATE 875-125 MG PO TABS: 1.0000 | ORAL_TABLET | Freq: Two times a day (BID) | ORAL | 0 refills | Status: AC

## 2018-03-10 NOTE — Progress Notes (Signed)
We are sorry that you are not feeling well.  Here is how we plan to help!  Based on what you have shared with me it looks like you have sinusitis.  Sinusitis is inflammation and infection in the sinus cavities of the head.  Based on your presentation I believe you most likely have Acute Bacterial Sinusitis.  This is an infection caused by bacteria and is treated with antibiotics. I have prescribed Augmentin 875mg/125mg one tablet twice daily with food, for 10 days. You may use an oral decongestant such as Mucinex D or if you have glaucoma or high blood pressure use plain Mucinex. Saline nasal spray help and can safely be used as often as needed for congestion.  If you develop worsening sinus pain, fever or notice severe headache and vision changes, or if symptoms are not better after completion of antibiotic, please schedule an appointment with a health care provider.    Sinus infections are not as easily transmitted as other respiratory infection, however we still recommend that you avoid close contact with loved ones, especially the very young and elderly.  Remember to wash your hands thoroughly throughout the day as this is the number one way to prevent the spread of infection!  Home Care:  Only take medications as instructed by your medical team.  Complete the entire course of an antibiotic.  Do not take these medications with alcohol.  A steam or ultrasonic humidifier can help congestion.  You can place a towel over your head and breathe in the steam from hot water coming from a faucet.  Avoid close contacts especially the very young and the elderly.  Cover your mouth when you cough or sneeze.  Always remember to wash your hands.  Get Help Right Away If:  You develop worsening fever or sinus pain.  You develop a severe head ache or visual changes.  Your symptoms persist after you have completed your treatment plan.  Make sure you  Understand these instructions.  Will watch your  condition.  Will get help right away if you are not doing well or get worse.  Your e-visit answers were reviewed by a board certified advanced clinical practitioner to complete your personal care plan.  Depending on the condition, your plan could have included both over the counter or prescription medications.  If there is a problem please reply  once you have received a response from your provider.  Your safety is important to us.  If you have drug allergies check your prescription carefully.    You can use MyChart to ask questions about today's visit, request a non-urgent call back, or ask for a work or school excuse for 24 hours related to this e-Visit. If it has been greater than 24 hours you will need to follow up with your provider, or enter a new e-Visit to address those concerns.  You will get an e-mail in the next two days asking about your experience.  I hope that your e-visit has been valuable and will speed your recovery. Thank you for using e-visits.   

## 2018-04-19 DIAGNOSIS — F909 Attention-deficit hyperactivity disorder, unspecified type: Secondary | ICD-10-CM | POA: Insufficient documentation

## 2018-04-19 NOTE — Progress Notes (Signed)
04/22/2018 9:28 AM   Dustin Little 1961-08-08 [id number]  Referring provider: Glendon Axe, MD [facility] [place], [place] [address]  Chief Complaint  Patient presents with  . Benign Prostatic Hypertrophy  . Hypogonadism    HPI: Patient is 57 year old Caucasian male with  ED and BPH with LUTS who presents today for his yearly follow up.    Erectile dysfunction His SHIM score is 14, which is mild to moderate ED.   His previous SHIM score was 19.  He has been having difficulty with erections for over six years.   His major complaint is life stressors.  His libido is preserved.   His risk factors for ED are antidepressants, BPH, HLD and elevated HbgA1C .  He denies any painful erections or curvatures with his erections.   He has tried PDE5-inhibitors in the past with good success.   SHIM    Row Name 04/22/18 0918         SHIM: Over the last 6 months:   How do you rate your confidence that you could get and keep an erection?  Low     When you had erections with sexual stimulation, how often were your erections hard enough for penetration (entering your partner)?  Sometimes (about half the time)     During sexual intercourse, how often were you able to maintain your erection after you had penetrated (entered) your partner?  Sometimes (about half the time)     During sexual intercourse, how difficult was it to maintain your erection to completion of intercourse?  Difficult     When you attempted sexual intercourse, how often was it satisfactory for you?  Sometimes (about half the time)       SHIM Total Score   SHIM  14        Score: 1-7 Severe ED 8-11 Moderate ED 12-16 Mild-Moderate ED 17-21 Mild ED 22-25 No ED   BPH WITH LUTS His IPSS score today is 2, which is mild lower urinary tract symptomatology.  He is pleased with his quality life due to his urinary symptoms.  His previous I PSS score was 4/1.  He denies any dysuria, hematuria or  suprapubic pain.   He also denies any recent fevers, chills, nausea or vomiting.  He does not have a family history of PCa.  IPSS    Row Name 04/22/18 0900         International Prostate Symptom Score   How often have you had the sensation of not emptying your bladder?  Not at All     How often have you had to urinate less than every two hours?  Not at All     How often have you found you stopped and started again several times when you urinated?  Not at All     How often have you found it difficult to postpone urination?  Less than 1 in 5 times     How often have you had a weak urinary stream?  Not at All     How often have you had to strain to start urination?  Not at All     How many times did you typically get up at night to urinate?  1 Time     Total IPSS Score  2       Quality of Life due to urinary symptoms   If you were to spend the rest of your life with your  urinary condition just the way it is now how would you feel about that?  Pleased        Score:  1-7 Mild 8-19 Moderate 20-35 Severe   PMH: Past Medical History:  Diagnosis Date  . Anxiety   . BPH (benign prostatic hypertrophy)   . BXO (balanitis xerotica obliterans)   . Depression   . ED (erectile dysfunction)   . Heartburn   . History of kidney stones   . Hypogonadism in male     Surgical History: Past Surgical History:  Procedure Laterality Date  . CHOLECYSTECTOMY    . CORNEAL TRANSPLANT  02/04/2013  . HERNIA REPAIR      Home Medications:  Allergies as of 04/22/2018      Reactions   Orphenadrine Citrate Other (See Comments)   Blurred vision extremely      Medication List        Accurate as of 04/22/18  9:28 AM. Always use your most recent med list.          amphetamine-dextroamphetamine 15 MG 24 hr capsule Commonly known as:  ADDERALL XR   buPROPion 300 MG 24 hr tablet Commonly known as:  WELLBUTRIN XL Take by mouth.   sildenafil 20 MG tablet Commonly known as:  REVATIO 2-5 tabs  prior to intercourse   tadalafil 5 MG tablet Commonly known as:  CIALIS Take 1 tablet (5 mg total) by mouth daily as needed for erectile dysfunction.       Allergies:  Allergies  Allergen Reactions  . Orphenadrine Citrate Other (See Comments)    Blurred vision extremely    Family History: Family History  Problem Relation Age of Onset  . Cancer Unknown   . Kidney disease Neg Hx   . Prostate cancer Neg Hx     Social History:  reports that he has never smoked. He has never used smokeless tobacco. He reports that he drinks alcohol. He reports that he does not use drugs.  ROS: UROLOGY Frequent Urination?: No Hard to postpone urination?: No Burning/pain with urination?: No Get up at night to urinate?: No Leakage of urine?: No Urine stream starts and stops?: No Trouble starting stream?: No Do you have to strain to urinate?: No Blood in urine?: No Urinary tract infection?: No Sexually transmitted disease?: No Injury to kidneys or bladder?: No Painful intercourse?: No Weak stream?: No Erection problems?: Yes Penile pain?: No  Gastrointestinal Nausea?: No Vomiting?: No Indigestion/heartburn?: No Diarrhea?: No Constipation?: No  Constitutional Fever: No Night sweats?: No Weight loss?: No Fatigue?: No  Skin Skin rash/lesions?: No Itching?: No  Eyes Blurred vision?: No Double vision?: No  Ears/Nose/Throat Sore throat?: No Sinus problems?: No  Hematologic/Lymphatic Swollen glands?: No Easy bruising?: No  Cardiovascular Leg swelling?: No Chest pain?: No  Respiratory Cough?: No Shortness of breath?: No  Endocrine Excessive thirst?: No  Musculoskeletal Back pain?: No Joint pain?: No  Neurological Headaches?: No Dizziness?: No  Psychologic Depression?: Yes Anxiety?: Yes  Physical Exam: BP 120/76   Pulse 82   Wt 204 lb 4.8 oz (92.7 kg)   BMI 24.87 kg/m   Constitutional: Well nourished. Alert and oriented, No acute distress. HEENT:  [place] AT, moist mucus membranes. Trachea midline, no masses. Cardiovascular: No clubbing, cyanosis, or edema. Respiratory: Normal respiratory effort, no increased work of breathing. GI: Abdomen is soft, non tender, non distended, no abdominal masses. Liver and spleen not palpable.  No hernias appreciated.  Stool sample for occult testing is not indicated.  GU: No CVA tenderness.  No bladder fullness or masses.  Patient with circumcised phallus.  Urethral meatus is patent.  No penile discharge. No penile lesions or rashes. Scrotum without lesions, cysts, rashes and/or edema.  Testicles are located scrotally bilaterally. No masses are appreciated in the testicles. Left and right epididymis are normal. Rectal: Patient with  normal sphincter tone. Anus and perineum without scarring or rashes. No rectal masses are appreciated. Prostate is approximately 55 grams, no nodules are appreciated. Seminal vesicles are normal. Skin: No rashes, bruises or suspicious lesions. Lymph: No cervical or inguinal adenopathy. Neurologic: Grossly intact, no focal deficits, moving all 4 extremities. Psychiatric: Normal mood and affect. Right breast with gynecomastia and 15 mm x 8 mm lump in the 12 o'clock position right above the nipple   Laboratory Data: PSA History  0.8 ng/mL on 02/10/2013  0.5 ng/mL on 02/10/2014  0.7 ng/mL on 02/14/2015  0.5 ng/mL on 03/05/2016  0.6 ng/mL on 03/10/2017 I have reviewed the labs.  Assessment & Plan:    1. Erectile dysfunction SHIM score is 14,  it worsening  We will obtain a serum testosterone level at this time; if it is abnormal we will need to repeat the study for confirmation RTC in 12 months for repeat SHIM score and exam   2. BPH with LUTS IPSS score is 2/1, it is improving Continue conservative management, avoiding bladder irritants and timed voiding's RTC in 12 months for IPSS, PSA and exam   3.  Right breast lump Patient will be scheduled for a mammogram and  breast ultrasound   Return in about 1 year (around 04/23/2019) for IPSS, SHIM, PSA and exam.  These notes generated with voice recognition software. I apologize for typographical errors.  Zara Council, PA-C  [facility] [address] [place], [place] [address] [phone]

## 2018-04-22 ENCOUNTER — Encounter: Payer: Self-pay | Admitting: Urology

## 2018-04-22 ENCOUNTER — Ambulatory Visit (INDEPENDENT_AMBULATORY_CARE_PROVIDER_SITE_OTHER): Payer: 59 | Admitting: Urology

## 2018-04-22 VITALS — BP 120/76 | HR 82 | Wt 204.3 lb

## 2018-04-22 DIAGNOSIS — N63 Unspecified lump in unspecified breast: Secondary | ICD-10-CM | POA: Diagnosis not present

## 2018-04-22 DIAGNOSIS — N138 Other obstructive and reflux uropathy: Secondary | ICD-10-CM | POA: Diagnosis not present

## 2018-04-22 DIAGNOSIS — N401 Enlarged prostate with lower urinary tract symptoms: Secondary | ICD-10-CM

## 2018-04-22 DIAGNOSIS — N529 Male erectile dysfunction, unspecified: Secondary | ICD-10-CM | POA: Diagnosis not present

## 2018-04-22 MED ORDER — SILDENAFIL CITRATE 20 MG PO TABS: ORAL_TABLET | ORAL | 0 refills | Status: DC

## 2018-04-23 ENCOUNTER — Telehealth: Payer: Self-pay | Admitting: Family Medicine

## 2018-04-23 LAB — SPECIMEN STATUS REPORT

## 2018-04-23 LAB — PSA: Prostate Specific Ag, Serum: 0.6 ng/mL (ref 0.0–4.0)

## 2018-04-23 LAB — TESTOSTERONE: Testosterone: 437 ng/dL (ref 264–916)

## 2018-04-23 LAB — TSH: TSH: 1.91 u[IU]/mL (ref 0.450–4.500)

## 2018-04-23 NOTE — Telephone Encounter (Signed)
-----   Message from Nori Riis, PA-C sent at 04/23/2018  7:47 AM EDT ----- Please let Mr. Jablonsky know that his PSA and testosterone are normal.

## 2018-04-23 NOTE — Telephone Encounter (Signed)
Unable to leave message, no voicemail set up.

## 2018-04-24 NOTE — Telephone Encounter (Signed)
-----   Message from Nori Riis, PA-C sent at 04/24/2018  7:56 AM EDT ----- Please let Mr. Pozzi know that his thyroid test was normal.

## 2018-04-24 NOTE — Telephone Encounter (Signed)
Called pt informed him of all lab results. Pt gave verbal understanding.

## 2018-04-30 ENCOUNTER — Ambulatory Visit
Admission: RE | Admit: 2018-04-30 | Discharge: 2018-04-30 | Disposition: A | Discharge status: Home / Self Care | Payer: 59 | Source: Ambulatory Visit | Attending: Urology | Admitting: Urology

## 2018-04-30 DIAGNOSIS — R928 Other abnormal and inconclusive findings on diagnostic imaging of breast: Secondary | ICD-10-CM | POA: Diagnosis not present

## 2018-04-30 DIAGNOSIS — N63 Unspecified lump in unspecified breast: Secondary | ICD-10-CM | POA: Insufficient documentation

## 2018-04-30 DIAGNOSIS — N6310 Unspecified lump in the right breast, unspecified quadrant: Secondary | ICD-10-CM | POA: Diagnosis not present

## 2018-05-01 ENCOUNTER — Telehealth: Payer: Self-pay

## 2018-05-01 NOTE — Telephone Encounter (Signed)
Pt informed, declined referral

## 2018-05-01 NOTE — Telephone Encounter (Signed)
-----   Message from Nori Riis, PA-C sent at 04/30/2018 10:53 AM EDT ----- Please let [name] know that his breast exam is negative for cancer.  We can offer him a referral to a general surgeon if he desires to have a discussion regarding removal of the breast tissue.

## 2018-05-26 ENCOUNTER — Other Ambulatory Visit: Payer: Self-pay

## 2018-05-26 DIAGNOSIS — N529 Male erectile dysfunction, unspecified: Secondary | ICD-10-CM

## 2018-05-26 MED ORDER — SILDENAFIL CITRATE 20 MG PO TABS: ORAL_TABLET | ORAL | 0 refills | Status: DC

## 2018-05-26 NOTE — Telephone Encounter (Signed)
Incoming request from [facility] for a new RX for sildenafil. Pt's previous pharmacy ([facility]) has closed and is no longer open for business. Will send in rx to new pharmacy.

## 2018-06-02 ENCOUNTER — Other Ambulatory Visit: Payer: Self-pay

## 2018-06-02 DIAGNOSIS — N529 Male erectile dysfunction, unspecified: Secondary | ICD-10-CM

## 2018-06-02 MED ORDER — SILDENAFIL CITRATE 20 MG PO TABS: ORAL_TABLET | ORAL | 0 refills | Status: DC

## 2018-06-05 DIAGNOSIS — H16142 Punctate keratitis, left eye: Secondary | ICD-10-CM | POA: Diagnosis not present

## 2018-06-08 DIAGNOSIS — Z1211 Encounter for screening for malignant neoplasm of colon: Secondary | ICD-10-CM | POA: Diagnosis not present

## 2018-06-08 DIAGNOSIS — K219 Gastro-esophageal reflux disease without esophagitis: Secondary | ICD-10-CM | POA: Diagnosis not present

## 2018-06-08 DIAGNOSIS — K648 Other hemorrhoids: Secondary | ICD-10-CM | POA: Diagnosis not present

## 2018-06-26 ENCOUNTER — Telehealth: Payer: Self-pay | Admitting: Urology

## 2018-06-26 NOTE — Telephone Encounter (Signed)
[facility] called asking if we has received a refill request for sildenafil for this patient? Please call back at [phone] option [phone]  [email or web address]

## 2018-06-26 NOTE — Telephone Encounter (Signed)
Called pt clarified that his current pharmacy is [facility] in [place]. [facility] is NO longer accurate.

## 2018-06-30 DIAGNOSIS — M5413 Radiculopathy, cervicothoracic region: Secondary | ICD-10-CM | POA: Diagnosis not present

## 2018-06-30 DIAGNOSIS — M9901 Segmental and somatic dysfunction of cervical region: Secondary | ICD-10-CM | POA: Diagnosis not present

## 2018-06-30 DIAGNOSIS — M50322 Other cervical disc degeneration at C5-C6 level: Secondary | ICD-10-CM | POA: Diagnosis not present

## 2018-06-30 DIAGNOSIS — M50323 Other cervical disc degeneration at C6-C7 level: Secondary | ICD-10-CM | POA: Diagnosis not present

## 2018-06-30 DIAGNOSIS — M5412 Radiculopathy, cervical region: Secondary | ICD-10-CM | POA: Diagnosis not present

## 2018-06-30 DIAGNOSIS — M7918 Myalgia, other site: Secondary | ICD-10-CM | POA: Diagnosis not present

## 2018-06-30 DIAGNOSIS — M542 Cervicalgia: Secondary | ICD-10-CM | POA: Diagnosis not present

## 2018-06-30 DIAGNOSIS — M50321 Other cervical disc degeneration at C4-C5 level: Secondary | ICD-10-CM | POA: Diagnosis not present

## 2018-07-01 DIAGNOSIS — M9901 Segmental and somatic dysfunction of cervical region: Secondary | ICD-10-CM | POA: Diagnosis not present

## 2018-07-01 DIAGNOSIS — M7918 Myalgia, other site: Secondary | ICD-10-CM | POA: Diagnosis not present

## 2018-07-01 DIAGNOSIS — M50321 Other cervical disc degeneration at C4-C5 level: Secondary | ICD-10-CM | POA: Diagnosis not present

## 2018-07-01 DIAGNOSIS — M5413 Radiculopathy, cervicothoracic region: Secondary | ICD-10-CM | POA: Diagnosis not present

## 2018-07-01 DIAGNOSIS — M5412 Radiculopathy, cervical region: Secondary | ICD-10-CM | POA: Diagnosis not present

## 2018-07-01 DIAGNOSIS — M50323 Other cervical disc degeneration at C6-C7 level: Secondary | ICD-10-CM | POA: Diagnosis not present

## 2018-07-01 DIAGNOSIS — M50322 Other cervical disc degeneration at C5-C6 level: Secondary | ICD-10-CM | POA: Diagnosis not present

## 2018-07-01 DIAGNOSIS — M542 Cervicalgia: Secondary | ICD-10-CM | POA: Diagnosis not present

## 2018-07-02 DIAGNOSIS — M50323 Other cervical disc degeneration at C6-C7 level: Secondary | ICD-10-CM | POA: Diagnosis not present

## 2018-07-02 DIAGNOSIS — M542 Cervicalgia: Secondary | ICD-10-CM | POA: Diagnosis not present

## 2018-07-02 DIAGNOSIS — M50322 Other cervical disc degeneration at C5-C6 level: Secondary | ICD-10-CM | POA: Diagnosis not present

## 2018-07-02 DIAGNOSIS — M7918 Myalgia, other site: Secondary | ICD-10-CM | POA: Diagnosis not present

## 2018-07-02 DIAGNOSIS — M5413 Radiculopathy, cervicothoracic region: Secondary | ICD-10-CM | POA: Diagnosis not present

## 2018-07-02 DIAGNOSIS — M9901 Segmental and somatic dysfunction of cervical region: Secondary | ICD-10-CM | POA: Diagnosis not present

## 2018-07-02 DIAGNOSIS — M50321 Other cervical disc degeneration at C4-C5 level: Secondary | ICD-10-CM | POA: Diagnosis not present

## 2018-07-02 DIAGNOSIS — M5412 Radiculopathy, cervical region: Secondary | ICD-10-CM | POA: Diagnosis not present

## 2018-07-07 DIAGNOSIS — M9901 Segmental and somatic dysfunction of cervical region: Secondary | ICD-10-CM | POA: Diagnosis not present

## 2018-07-07 DIAGNOSIS — M7918 Myalgia, other site: Secondary | ICD-10-CM | POA: Diagnosis not present

## 2018-07-07 DIAGNOSIS — M50321 Other cervical disc degeneration at C4-C5 level: Secondary | ICD-10-CM | POA: Diagnosis not present

## 2018-07-07 DIAGNOSIS — M542 Cervicalgia: Secondary | ICD-10-CM | POA: Diagnosis not present

## 2018-07-07 DIAGNOSIS — M5412 Radiculopathy, cervical region: Secondary | ICD-10-CM | POA: Diagnosis not present

## 2018-07-07 DIAGNOSIS — M50323 Other cervical disc degeneration at C6-C7 level: Secondary | ICD-10-CM | POA: Diagnosis not present

## 2018-07-07 DIAGNOSIS — M50322 Other cervical disc degeneration at C5-C6 level: Secondary | ICD-10-CM | POA: Diagnosis not present

## 2018-07-07 DIAGNOSIS — M5413 Radiculopathy, cervicothoracic region: Secondary | ICD-10-CM | POA: Diagnosis not present

## 2018-07-08 ENCOUNTER — Other Ambulatory Visit: Payer: Self-pay | Admitting: Urology

## 2018-07-08 DIAGNOSIS — M50321 Other cervical disc degeneration at C4-C5 level: Secondary | ICD-10-CM | POA: Diagnosis not present

## 2018-07-08 DIAGNOSIS — M5412 Radiculopathy, cervical region: Secondary | ICD-10-CM | POA: Diagnosis not present

## 2018-07-08 DIAGNOSIS — M50323 Other cervical disc degeneration at C6-C7 level: Secondary | ICD-10-CM | POA: Diagnosis not present

## 2018-07-08 DIAGNOSIS — M7918 Myalgia, other site: Secondary | ICD-10-CM | POA: Diagnosis not present

## 2018-07-08 DIAGNOSIS — N529 Male erectile dysfunction, unspecified: Secondary | ICD-10-CM

## 2018-07-08 DIAGNOSIS — M542 Cervicalgia: Secondary | ICD-10-CM | POA: Diagnosis not present

## 2018-07-08 DIAGNOSIS — M9901 Segmental and somatic dysfunction of cervical region: Secondary | ICD-10-CM | POA: Diagnosis not present

## 2018-07-08 DIAGNOSIS — M5413 Radiculopathy, cervicothoracic region: Secondary | ICD-10-CM | POA: Diagnosis not present

## 2018-07-08 DIAGNOSIS — M50322 Other cervical disc degeneration at C5-C6 level: Secondary | ICD-10-CM | POA: Diagnosis not present

## 2018-07-10 DIAGNOSIS — M5413 Radiculopathy, cervicothoracic region: Secondary | ICD-10-CM | POA: Diagnosis not present

## 2018-07-10 DIAGNOSIS — M50321 Other cervical disc degeneration at C4-C5 level: Secondary | ICD-10-CM | POA: Diagnosis not present

## 2018-07-10 DIAGNOSIS — M50323 Other cervical disc degeneration at C6-C7 level: Secondary | ICD-10-CM | POA: Diagnosis not present

## 2018-07-10 DIAGNOSIS — M9901 Segmental and somatic dysfunction of cervical region: Secondary | ICD-10-CM | POA: Diagnosis not present

## 2018-07-10 DIAGNOSIS — M542 Cervicalgia: Secondary | ICD-10-CM | POA: Diagnosis not present

## 2018-07-10 DIAGNOSIS — M7918 Myalgia, other site: Secondary | ICD-10-CM | POA: Diagnosis not present

## 2018-07-10 DIAGNOSIS — M50322 Other cervical disc degeneration at C5-C6 level: Secondary | ICD-10-CM | POA: Diagnosis not present

## 2018-07-10 DIAGNOSIS — M5412 Radiculopathy, cervical region: Secondary | ICD-10-CM | POA: Diagnosis not present

## 2018-07-13 DIAGNOSIS — M7918 Myalgia, other site: Secondary | ICD-10-CM | POA: Diagnosis not present

## 2018-07-13 DIAGNOSIS — M5413 Radiculopathy, cervicothoracic region: Secondary | ICD-10-CM | POA: Diagnosis not present

## 2018-07-13 DIAGNOSIS — M542 Cervicalgia: Secondary | ICD-10-CM | POA: Diagnosis not present

## 2018-07-13 DIAGNOSIS — M50323 Other cervical disc degeneration at C6-C7 level: Secondary | ICD-10-CM | POA: Diagnosis not present

## 2018-07-13 DIAGNOSIS — M50321 Other cervical disc degeneration at C4-C5 level: Secondary | ICD-10-CM | POA: Diagnosis not present

## 2018-07-13 DIAGNOSIS — M9901 Segmental and somatic dysfunction of cervical region: Secondary | ICD-10-CM | POA: Diagnosis not present

## 2018-07-13 DIAGNOSIS — M5412 Radiculopathy, cervical region: Secondary | ICD-10-CM | POA: Diagnosis not present

## 2018-07-13 DIAGNOSIS — M50322 Other cervical disc degeneration at C5-C6 level: Secondary | ICD-10-CM | POA: Diagnosis not present

## 2018-07-17 DIAGNOSIS — M9901 Segmental and somatic dysfunction of cervical region: Secondary | ICD-10-CM | POA: Diagnosis not present

## 2018-07-17 DIAGNOSIS — M7918 Myalgia, other site: Secondary | ICD-10-CM | POA: Diagnosis not present

## 2018-07-17 DIAGNOSIS — M5412 Radiculopathy, cervical region: Secondary | ICD-10-CM | POA: Diagnosis not present

## 2018-07-17 DIAGNOSIS — M50323 Other cervical disc degeneration at C6-C7 level: Secondary | ICD-10-CM | POA: Diagnosis not present

## 2018-07-17 DIAGNOSIS — M50321 Other cervical disc degeneration at C4-C5 level: Secondary | ICD-10-CM | POA: Diagnosis not present

## 2018-07-17 DIAGNOSIS — M50322 Other cervical disc degeneration at C5-C6 level: Secondary | ICD-10-CM | POA: Diagnosis not present

## 2018-07-17 DIAGNOSIS — M5413 Radiculopathy, cervicothoracic region: Secondary | ICD-10-CM | POA: Diagnosis not present

## 2018-07-17 DIAGNOSIS — M542 Cervicalgia: Secondary | ICD-10-CM | POA: Diagnosis not present

## 2018-07-22 DIAGNOSIS — M50322 Other cervical disc degeneration at C5-C6 level: Secondary | ICD-10-CM | POA: Diagnosis not present

## 2018-07-22 DIAGNOSIS — M7918 Myalgia, other site: Secondary | ICD-10-CM | POA: Diagnosis not present

## 2018-07-22 DIAGNOSIS — M5412 Radiculopathy, cervical region: Secondary | ICD-10-CM | POA: Diagnosis not present

## 2018-07-22 DIAGNOSIS — M542 Cervicalgia: Secondary | ICD-10-CM | POA: Diagnosis not present

## 2018-07-22 DIAGNOSIS — M9901 Segmental and somatic dysfunction of cervical region: Secondary | ICD-10-CM | POA: Diagnosis not present

## 2018-07-22 DIAGNOSIS — M5413 Radiculopathy, cervicothoracic region: Secondary | ICD-10-CM | POA: Diagnosis not present

## 2018-07-22 DIAGNOSIS — M50323 Other cervical disc degeneration at C6-C7 level: Secondary | ICD-10-CM | POA: Diagnosis not present

## 2018-07-22 DIAGNOSIS — M50321 Other cervical disc degeneration at C4-C5 level: Secondary | ICD-10-CM | POA: Diagnosis not present

## 2018-07-23 DIAGNOSIS — Z1211 Encounter for screening for malignant neoplasm of colon: Secondary | ICD-10-CM | POA: Diagnosis not present

## 2018-07-23 DIAGNOSIS — K2950 Unspecified chronic gastritis without bleeding: Secondary | ICD-10-CM | POA: Diagnosis not present

## 2018-07-23 DIAGNOSIS — K210 Gastro-esophageal reflux disease with esophagitis: Secondary | ICD-10-CM | POA: Diagnosis not present

## 2018-07-23 DIAGNOSIS — K449 Diaphragmatic hernia without obstruction or gangrene: Secondary | ICD-10-CM | POA: Diagnosis not present

## 2018-07-23 DIAGNOSIS — D123 Benign neoplasm of transverse colon: Secondary | ICD-10-CM | POA: Diagnosis not present

## 2018-07-23 DIAGNOSIS — K635 Polyp of colon: Secondary | ICD-10-CM | POA: Diagnosis not present

## 2018-07-23 DIAGNOSIS — K2970 Gastritis, unspecified, without bleeding: Secondary | ICD-10-CM | POA: Diagnosis not present

## 2018-07-23 DIAGNOSIS — K648 Other hemorrhoids: Secondary | ICD-10-CM | POA: Diagnosis not present

## 2018-07-23 DIAGNOSIS — K3189 Other diseases of stomach and duodenum: Secondary | ICD-10-CM | POA: Diagnosis not present

## 2018-07-27 DIAGNOSIS — M5412 Radiculopathy, cervical region: Secondary | ICD-10-CM | POA: Diagnosis not present

## 2018-07-27 DIAGNOSIS — M5413 Radiculopathy, cervicothoracic region: Secondary | ICD-10-CM | POA: Diagnosis not present

## 2018-07-27 DIAGNOSIS — M542 Cervicalgia: Secondary | ICD-10-CM | POA: Diagnosis not present

## 2018-07-27 DIAGNOSIS — Z9889 Other specified postprocedural states: Secondary | ICD-10-CM | POA: Diagnosis not present

## 2018-07-27 DIAGNOSIS — M50323 Other cervical disc degeneration at C6-C7 level: Secondary | ICD-10-CM | POA: Diagnosis not present

## 2018-07-27 DIAGNOSIS — M50321 Other cervical disc degeneration at C4-C5 level: Secondary | ICD-10-CM | POA: Diagnosis not present

## 2018-07-27 DIAGNOSIS — M7918 Myalgia, other site: Secondary | ICD-10-CM | POA: Diagnosis not present

## 2018-07-27 DIAGNOSIS — M9901 Segmental and somatic dysfunction of cervical region: Secondary | ICD-10-CM | POA: Diagnosis not present

## 2018-07-27 DIAGNOSIS — H179 Unspecified corneal scar and opacity: Secondary | ICD-10-CM | POA: Diagnosis not present

## 2018-07-27 DIAGNOSIS — M50322 Other cervical disc degeneration at C5-C6 level: Secondary | ICD-10-CM | POA: Diagnosis not present

## 2018-07-29 DIAGNOSIS — M542 Cervicalgia: Secondary | ICD-10-CM | POA: Diagnosis not present

## 2018-07-29 DIAGNOSIS — M7918 Myalgia, other site: Secondary | ICD-10-CM | POA: Diagnosis not present

## 2018-07-29 DIAGNOSIS — M9901 Segmental and somatic dysfunction of cervical region: Secondary | ICD-10-CM | POA: Diagnosis not present

## 2018-07-29 DIAGNOSIS — M50321 Other cervical disc degeneration at C4-C5 level: Secondary | ICD-10-CM | POA: Diagnosis not present

## 2018-07-29 DIAGNOSIS — M5413 Radiculopathy, cervicothoracic region: Secondary | ICD-10-CM | POA: Diagnosis not present

## 2018-07-29 DIAGNOSIS — M50322 Other cervical disc degeneration at C5-C6 level: Secondary | ICD-10-CM | POA: Diagnosis not present

## 2018-07-29 DIAGNOSIS — M5412 Radiculopathy, cervical region: Secondary | ICD-10-CM | POA: Diagnosis not present

## 2018-07-29 DIAGNOSIS — M50323 Other cervical disc degeneration at C6-C7 level: Secondary | ICD-10-CM | POA: Diagnosis not present

## 2018-07-31 DIAGNOSIS — H179 Unspecified corneal scar and opacity: Secondary | ICD-10-CM | POA: Diagnosis not present

## 2018-07-31 DIAGNOSIS — H524 Presbyopia: Secondary | ICD-10-CM | POA: Diagnosis not present

## 2018-08-03 DIAGNOSIS — M9901 Segmental and somatic dysfunction of cervical region: Secondary | ICD-10-CM | POA: Diagnosis not present

## 2018-08-03 DIAGNOSIS — M50323 Other cervical disc degeneration at C6-C7 level: Secondary | ICD-10-CM | POA: Diagnosis not present

## 2018-08-03 DIAGNOSIS — M50321 Other cervical disc degeneration at C4-C5 level: Secondary | ICD-10-CM | POA: Diagnosis not present

## 2018-08-03 DIAGNOSIS — M5413 Radiculopathy, cervicothoracic region: Secondary | ICD-10-CM | POA: Diagnosis not present

## 2018-08-03 DIAGNOSIS — M50322 Other cervical disc degeneration at C5-C6 level: Secondary | ICD-10-CM | POA: Diagnosis not present

## 2018-08-03 DIAGNOSIS — M542 Cervicalgia: Secondary | ICD-10-CM | POA: Diagnosis not present

## 2018-08-03 DIAGNOSIS — M7918 Myalgia, other site: Secondary | ICD-10-CM | POA: Diagnosis not present

## 2018-08-03 DIAGNOSIS — M5412 Radiculopathy, cervical region: Secondary | ICD-10-CM | POA: Diagnosis not present

## 2018-08-05 ENCOUNTER — Other Ambulatory Visit: Payer: Self-pay | Admitting: Urology

## 2018-08-05 DIAGNOSIS — N529 Male erectile dysfunction, unspecified: Secondary | ICD-10-CM

## 2018-08-06 ENCOUNTER — Other Ambulatory Visit: Payer: Self-pay | Admitting: Family Medicine

## 2018-08-06 DIAGNOSIS — M9901 Segmental and somatic dysfunction of cervical region: Secondary | ICD-10-CM | POA: Diagnosis not present

## 2018-08-06 DIAGNOSIS — M50322 Other cervical disc degeneration at C5-C6 level: Secondary | ICD-10-CM | POA: Diagnosis not present

## 2018-08-06 DIAGNOSIS — M50321 Other cervical disc degeneration at C4-C5 level: Secondary | ICD-10-CM | POA: Diagnosis not present

## 2018-08-06 DIAGNOSIS — M7918 Myalgia, other site: Secondary | ICD-10-CM | POA: Diagnosis not present

## 2018-08-06 DIAGNOSIS — M542 Cervicalgia: Secondary | ICD-10-CM | POA: Diagnosis not present

## 2018-08-06 DIAGNOSIS — M5413 Radiculopathy, cervicothoracic region: Secondary | ICD-10-CM | POA: Diagnosis not present

## 2018-08-06 DIAGNOSIS — M50323 Other cervical disc degeneration at C6-C7 level: Secondary | ICD-10-CM | POA: Diagnosis not present

## 2018-08-06 DIAGNOSIS — N529 Male erectile dysfunction, unspecified: Secondary | ICD-10-CM

## 2018-08-06 DIAGNOSIS — M5412 Radiculopathy, cervical region: Secondary | ICD-10-CM | POA: Diagnosis not present

## 2018-08-06 MED ORDER — SILDENAFIL CITRATE 20 MG PO TABS: ORAL_TABLET | ORAL | 0 refills | Status: DC

## 2018-08-10 DIAGNOSIS — M50323 Other cervical disc degeneration at C6-C7 level: Secondary | ICD-10-CM | POA: Diagnosis not present

## 2018-08-10 DIAGNOSIS — M9901 Segmental and somatic dysfunction of cervical region: Secondary | ICD-10-CM | POA: Diagnosis not present

## 2018-08-10 DIAGNOSIS — M5413 Radiculopathy, cervicothoracic region: Secondary | ICD-10-CM | POA: Diagnosis not present

## 2018-08-10 DIAGNOSIS — M542 Cervicalgia: Secondary | ICD-10-CM | POA: Diagnosis not present

## 2018-08-10 DIAGNOSIS — M50321 Other cervical disc degeneration at C4-C5 level: Secondary | ICD-10-CM | POA: Diagnosis not present

## 2018-08-10 DIAGNOSIS — M5412 Radiculopathy, cervical region: Secondary | ICD-10-CM | POA: Diagnosis not present

## 2018-08-10 DIAGNOSIS — M7918 Myalgia, other site: Secondary | ICD-10-CM | POA: Diagnosis not present

## 2018-08-10 DIAGNOSIS — M50322 Other cervical disc degeneration at C5-C6 level: Secondary | ICD-10-CM | POA: Diagnosis not present

## 2018-08-13 DIAGNOSIS — M5413 Radiculopathy, cervicothoracic region: Secondary | ICD-10-CM | POA: Diagnosis not present

## 2018-08-13 DIAGNOSIS — M50322 Other cervical disc degeneration at C5-C6 level: Secondary | ICD-10-CM | POA: Diagnosis not present

## 2018-08-13 DIAGNOSIS — F902 Attention-deficit hyperactivity disorder, combined type: Secondary | ICD-10-CM | POA: Diagnosis not present

## 2018-08-13 DIAGNOSIS — M5412 Radiculopathy, cervical region: Secondary | ICD-10-CM | POA: Diagnosis not present

## 2018-08-13 DIAGNOSIS — M50321 Other cervical disc degeneration at C4-C5 level: Secondary | ICD-10-CM | POA: Diagnosis not present

## 2018-08-13 DIAGNOSIS — M542 Cervicalgia: Secondary | ICD-10-CM | POA: Diagnosis not present

## 2018-08-13 DIAGNOSIS — M50323 Other cervical disc degeneration at C6-C7 level: Secondary | ICD-10-CM | POA: Diagnosis not present

## 2018-08-13 DIAGNOSIS — M9901 Segmental and somatic dysfunction of cervical region: Secondary | ICD-10-CM | POA: Diagnosis not present

## 2018-08-13 DIAGNOSIS — M7918 Myalgia, other site: Secondary | ICD-10-CM | POA: Diagnosis not present

## 2018-08-18 ENCOUNTER — Other Ambulatory Visit: Payer: Self-pay | Admitting: Family Medicine

## 2018-08-18 DIAGNOSIS — N529 Male erectile dysfunction, unspecified: Secondary | ICD-10-CM

## 2018-08-20 DIAGNOSIS — M5413 Radiculopathy, cervicothoracic region: Secondary | ICD-10-CM | POA: Diagnosis not present

## 2018-08-20 DIAGNOSIS — M9901 Segmental and somatic dysfunction of cervical region: Secondary | ICD-10-CM | POA: Diagnosis not present

## 2018-08-20 DIAGNOSIS — M7918 Myalgia, other site: Secondary | ICD-10-CM | POA: Diagnosis not present

## 2018-08-20 DIAGNOSIS — M50322 Other cervical disc degeneration at C5-C6 level: Secondary | ICD-10-CM | POA: Diagnosis not present

## 2018-08-20 DIAGNOSIS — M50323 Other cervical disc degeneration at C6-C7 level: Secondary | ICD-10-CM | POA: Diagnosis not present

## 2018-08-20 DIAGNOSIS — M542 Cervicalgia: Secondary | ICD-10-CM | POA: Diagnosis not present

## 2018-08-20 DIAGNOSIS — M50321 Other cervical disc degeneration at C4-C5 level: Secondary | ICD-10-CM | POA: Diagnosis not present

## 2018-08-20 DIAGNOSIS — M5412 Radiculopathy, cervical region: Secondary | ICD-10-CM | POA: Diagnosis not present

## 2018-08-24 DIAGNOSIS — M50323 Other cervical disc degeneration at C6-C7 level: Secondary | ICD-10-CM | POA: Diagnosis not present

## 2018-08-24 DIAGNOSIS — M5412 Radiculopathy, cervical region: Secondary | ICD-10-CM | POA: Diagnosis not present

## 2018-08-24 DIAGNOSIS — M7918 Myalgia, other site: Secondary | ICD-10-CM | POA: Diagnosis not present

## 2018-08-24 DIAGNOSIS — M50322 Other cervical disc degeneration at C5-C6 level: Secondary | ICD-10-CM | POA: Diagnosis not present

## 2018-08-24 DIAGNOSIS — M9901 Segmental and somatic dysfunction of cervical region: Secondary | ICD-10-CM | POA: Diagnosis not present

## 2018-08-24 DIAGNOSIS — M5413 Radiculopathy, cervicothoracic region: Secondary | ICD-10-CM | POA: Diagnosis not present

## 2018-08-24 DIAGNOSIS — M542 Cervicalgia: Secondary | ICD-10-CM | POA: Diagnosis not present

## 2018-08-24 DIAGNOSIS — M50321 Other cervical disc degeneration at C4-C5 level: Secondary | ICD-10-CM | POA: Diagnosis not present

## 2018-08-31 DIAGNOSIS — M50323 Other cervical disc degeneration at C6-C7 level: Secondary | ICD-10-CM | POA: Diagnosis not present

## 2018-08-31 DIAGNOSIS — M542 Cervicalgia: Secondary | ICD-10-CM | POA: Diagnosis not present

## 2018-08-31 DIAGNOSIS — M7918 Myalgia, other site: Secondary | ICD-10-CM | POA: Diagnosis not present

## 2018-08-31 DIAGNOSIS — M50322 Other cervical disc degeneration at C5-C6 level: Secondary | ICD-10-CM | POA: Diagnosis not present

## 2018-08-31 DIAGNOSIS — M5413 Radiculopathy, cervicothoracic region: Secondary | ICD-10-CM | POA: Diagnosis not present

## 2018-08-31 DIAGNOSIS — M50321 Other cervical disc degeneration at C4-C5 level: Secondary | ICD-10-CM | POA: Diagnosis not present

## 2018-08-31 DIAGNOSIS — M9901 Segmental and somatic dysfunction of cervical region: Secondary | ICD-10-CM | POA: Diagnosis not present

## 2018-08-31 DIAGNOSIS — M5412 Radiculopathy, cervical region: Secondary | ICD-10-CM | POA: Diagnosis not present

## 2018-09-03 DIAGNOSIS — H16201 Unspecified keratoconjunctivitis, right eye: Secondary | ICD-10-CM | POA: Diagnosis not present

## 2018-09-03 DIAGNOSIS — M50322 Other cervical disc degeneration at C5-C6 level: Secondary | ICD-10-CM | POA: Diagnosis not present

## 2018-09-03 DIAGNOSIS — M542 Cervicalgia: Secondary | ICD-10-CM | POA: Diagnosis not present

## 2018-09-03 DIAGNOSIS — M5413 Radiculopathy, cervicothoracic region: Secondary | ICD-10-CM | POA: Diagnosis not present

## 2018-09-03 DIAGNOSIS — M5412 Radiculopathy, cervical region: Secondary | ICD-10-CM | POA: Diagnosis not present

## 2018-09-03 DIAGNOSIS — M50323 Other cervical disc degeneration at C6-C7 level: Secondary | ICD-10-CM | POA: Diagnosis not present

## 2018-09-03 DIAGNOSIS — M9901 Segmental and somatic dysfunction of cervical region: Secondary | ICD-10-CM | POA: Diagnosis not present

## 2018-09-03 DIAGNOSIS — M50321 Other cervical disc degeneration at C4-C5 level: Secondary | ICD-10-CM | POA: Diagnosis not present

## 2018-09-03 DIAGNOSIS — M7918 Myalgia, other site: Secondary | ICD-10-CM | POA: Diagnosis not present

## 2018-09-04 ENCOUNTER — Other Ambulatory Visit: Payer: Self-pay | Admitting: Urology

## 2018-09-04 DIAGNOSIS — N529 Male erectile dysfunction, unspecified: Secondary | ICD-10-CM

## 2018-09-28 DIAGNOSIS — M5413 Radiculopathy, cervicothoracic region: Secondary | ICD-10-CM | POA: Diagnosis not present

## 2018-09-28 DIAGNOSIS — M50322 Other cervical disc degeneration at C5-C6 level: Secondary | ICD-10-CM | POA: Diagnosis not present

## 2018-09-28 DIAGNOSIS — M7918 Myalgia, other site: Secondary | ICD-10-CM | POA: Diagnosis not present

## 2018-09-28 DIAGNOSIS — M50321 Other cervical disc degeneration at C4-C5 level: Secondary | ICD-10-CM | POA: Diagnosis not present

## 2018-09-28 DIAGNOSIS — M9901 Segmental and somatic dysfunction of cervical region: Secondary | ICD-10-CM | POA: Diagnosis not present

## 2018-09-28 DIAGNOSIS — M542 Cervicalgia: Secondary | ICD-10-CM | POA: Diagnosis not present

## 2018-09-28 DIAGNOSIS — M5412 Radiculopathy, cervical region: Secondary | ICD-10-CM | POA: Diagnosis not present

## 2018-09-28 DIAGNOSIS — M50323 Other cervical disc degeneration at C6-C7 level: Secondary | ICD-10-CM | POA: Diagnosis not present

## 2018-11-28 ENCOUNTER — Telehealth: Payer: 59 | Admitting: Nurse Practitioner

## 2018-11-28 DIAGNOSIS — J111 Influenza due to unidentified influenza virus with other respiratory manifestations: Secondary | ICD-10-CM

## 2018-11-28 MED ORDER — OSELTAMIVIR PHOSPHATE 75 MG PO CAPS: 75.0000 mg | ORAL_CAPSULE | Freq: Two times a day (BID) | ORAL | 0 refills | Status: DC

## 2018-11-28 NOTE — Progress Notes (Signed)

## 2018-12-28 IMAGING — US US BREAST*R* LIMITED INC AXILLA
1 series · 5 of 5 positions shown · non-contrast
Comparison: None

ACR Breast Density Category a: The breast tissue is almost entirely
fatty.

CLINICAL DATA: 56-year-old male presenting for evaluation of a
palpable lump in the superior right breast on clinical breast exam.

EXAM:
DIGITAL DIAGNOSTIC BILATERAL MAMMOGRAM WITH CAD AND TOMO
RIGHT BREAST ULTRASOUND

[Series 1: us breast*right* limited inc axilla · 0.06mm/px · 5 of 5 slices shown]
[im 1/5]
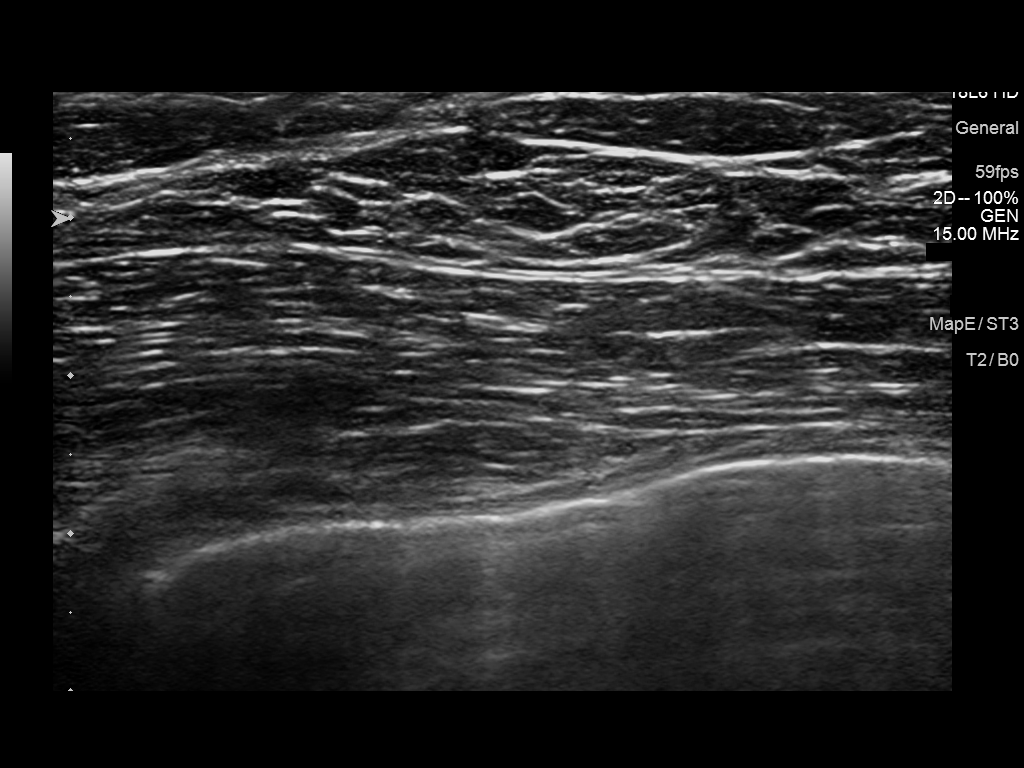
[im 2/5]
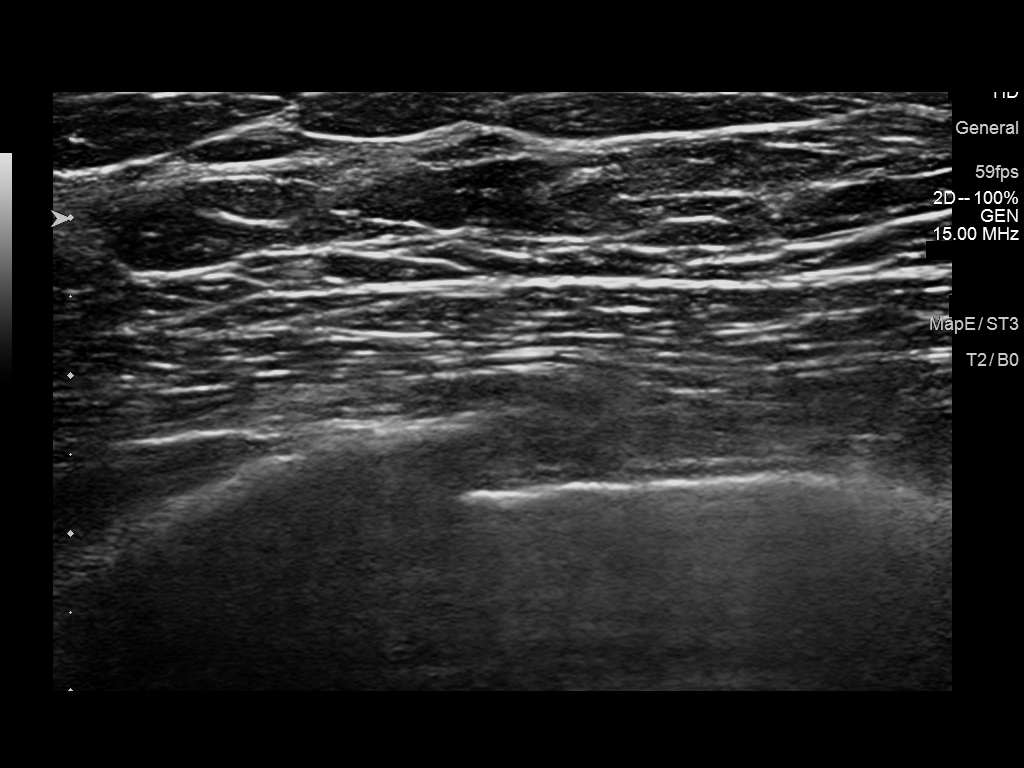
[im 3/5]
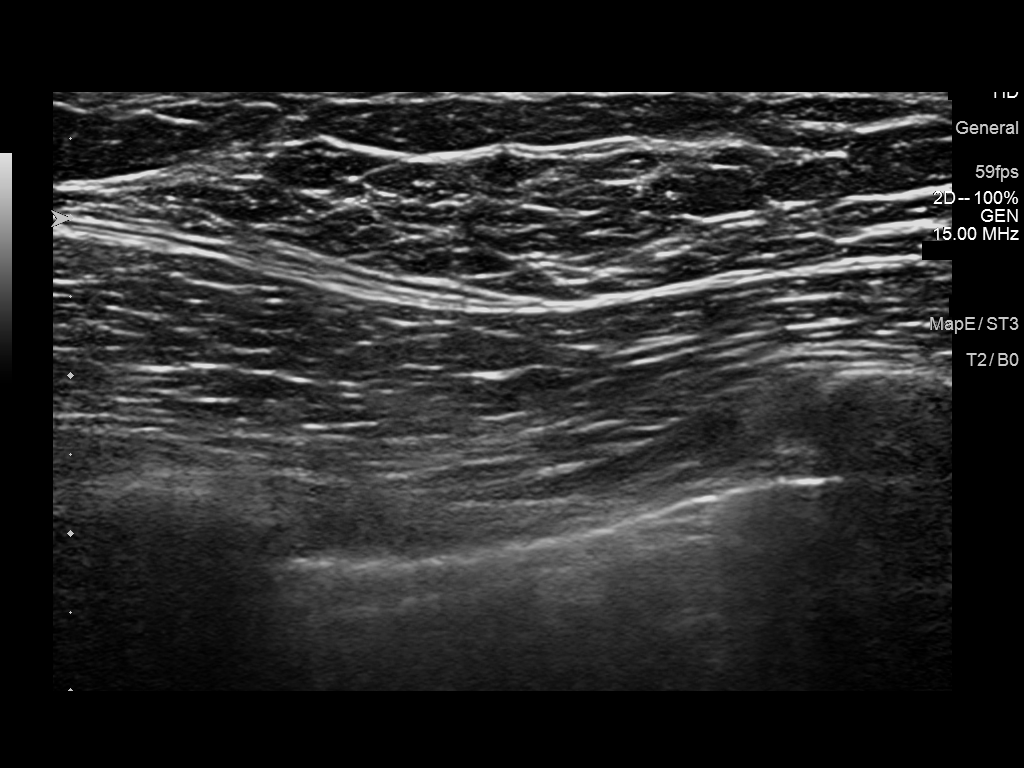
[im 4/5]
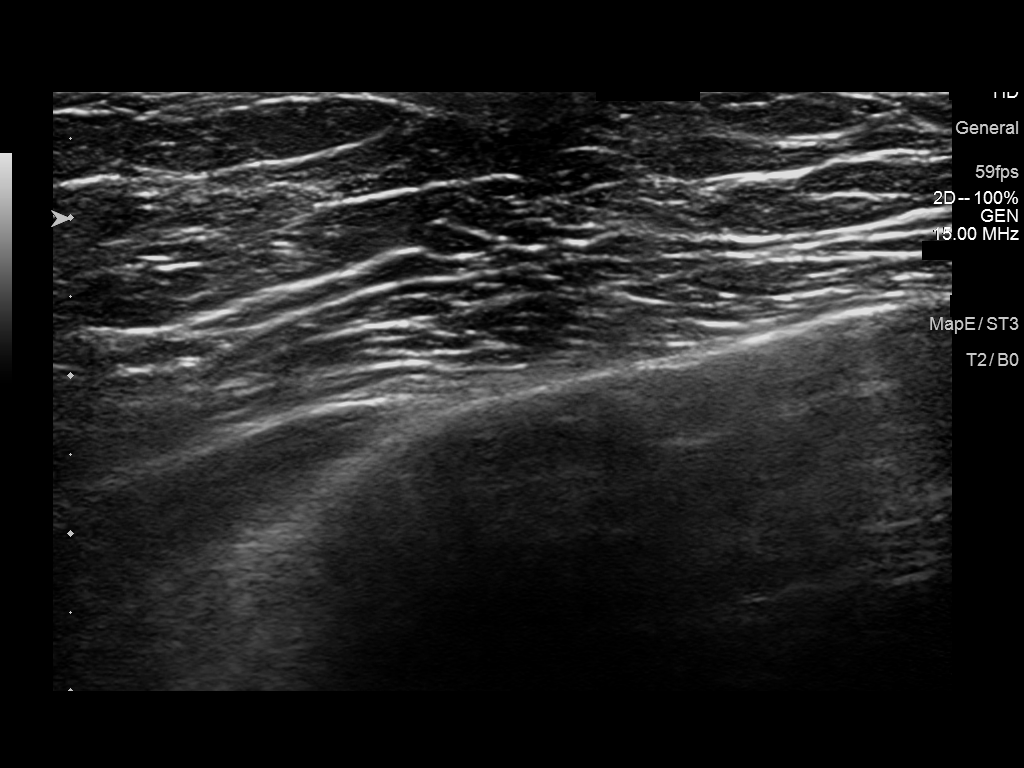
[im 5/5]
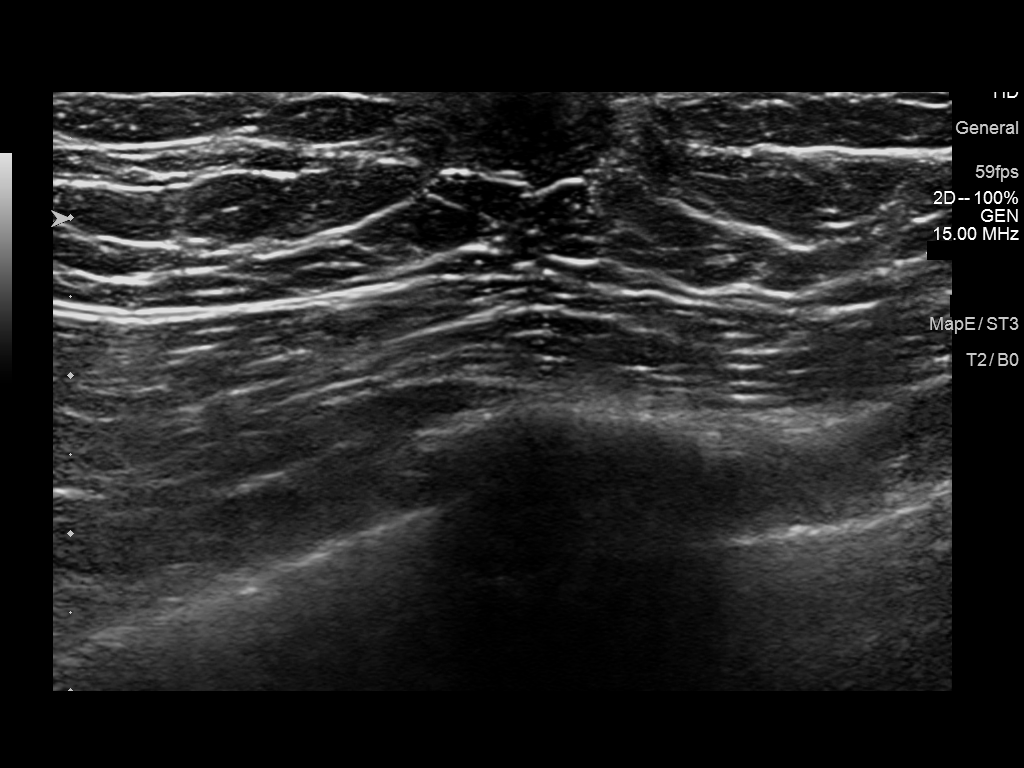

[5 of 5 positions shown; findings below may reference images not displayed]

FINDINGS: A BB has been placed on the superior right breast adjacent to the
nipple indicating the palpable site of concern. No suspicious
mammographic findings are identified deep to the palpable marker. A
small amount of gynecomastia seen bilaterally in the subareolar
breast. No suspicious calcifications, masses or areas of distortion
are seen in the bilateral breasts.

Mammographic images were processed with CAD.

Physical exam superior to the right nipple demonstrates a rubbery
firm ridge of tissue without suspicious mass identified.

Ultrasound of the superior right breast demonstrates normal
subcutaneous tissue. No masses or suspicious areas of shadowing are
identified.
IMPRESSION: 1. There are no suspicious mammographic or targeted sonographic
abnormalities at the palpable site of concern superior to the right
nipple.

2.  There is mild benign bilateral gynecomastia.

3.  No mammographic evidence of malignancy in the bilateral breasts.

RECOMMENDATION:
1. Clinical follow-up recommended for the palpable area of concern
in the right breast. Any further workup should be based on clinical
grounds.

I have discussed the findings and recommendations with the patient.
Results were also provided in writing at the conclusion of the
visit. If applicable, a reminder letter will be sent to the patient
regarding the next appointment.

BI-RADS CATEGORY  2: Benign.

## 2019-01-21 ENCOUNTER — Other Ambulatory Visit: Payer: Self-pay | Admitting: Urology

## 2019-01-21 DIAGNOSIS — N529 Male erectile dysfunction, unspecified: Secondary | ICD-10-CM

## 2019-04-22 NOTE — Progress Notes (Signed)
04/23/2019 9:56 AM   Dustin Little 1960-12-21 [id number]  Referring provider: Glendon Axe, MD [facility] [place],  [place] [address]  Chief Complaint  Patient presents with  . Erectile Dysfunction    HPI: Patient is 58 year old Caucasian male with  ED and BPH with LUTS who presents today for his yearly follow up.    Erectile dysfunction His SHIM score is 19 , which is mild ED.   His previous SHIM score was 14.  He has been having difficulty with erections for over six years.   His major complaint is life stressors.  His libido is preserved.   He works ten to twelve hours daily.  His risk factors for ED are antidepressants, BPH, HLD and elevated HbgA1C .  He denies any painful erections or curvatures with his erections.   He is still having spontaneous erections when he gets adequate sleep.  He has tried PDE5-inhibitors in the past with good success.   SHIM    Row Name 04/23/19 0847         SHIM: Over the last 6 months:   How do you rate your confidence that you could get and keep an erection?  Moderate     When you had erections with sexual stimulation, how often were your erections hard enough for penetration (entering your partner)?  Most Times (much more than half the time)     During sexual intercourse, how often were you able to maintain your erection after you had penetrated (entered) your partner?  Most Times (much more than half the time)     During sexual intercourse, how difficult was it to maintain your erection to completion of intercourse?  Slightly Difficult     When you attempted sexual intercourse, how often was it satisfactory for you?  Most Times (much more than half the time)       SHIM Total Score   SHIM  19        Score: 1-7 Severe ED 8-11 Moderate ED 12-16 Mild-Moderate ED 17-21 Mild ED 22-25 No ED   BPH WITH LUTS His IPSS score today is 13, which is moderate lower urinary tract symptomatology.  He is mixed with his  quality life due to his urinary symptoms.  His previous I PSS score was 2/1.  His main complaint today is frequency, urgency and weak stream that has started over the last two weeks.  He also mentioned that his urine is dark, yellow and contributes this to dehydration.  He also has a history of an elevated Hgb A1c, but he has not had this checked in years.  His UA is negative.  He denies any dysuria, hematuria or suprapubic pain.   He also denies any recent fevers, chills, nausea or vomiting.  He does not have a family history of PCa.  IPSS    Row Name 04/23/19 0800         International Prostate Symptom Score   How often have you had the sensation of not emptying your bladder?  Not at All     How often have you had to urinate less than every two hours?  About half the time     How often have you found you stopped and started again several times when you urinated?  Less than half the time     How often have you found it difficult to postpone urination?  More than half the time  How often have you had a weak urinary stream?  About half the time     How often have you had to strain to start urination?  Not at All     How many times did you typically get up at night to urinate?  1 Time     Total IPSS Score  13       Quality of Life due to urinary symptoms   If you were to spend the rest of your life with your urinary condition just the way it is now how would you feel about that?  Mixed        Score:  1-7 Mild 8-19 Moderate 20-35 Severe   PMH: Past Medical History:  Diagnosis Date  . Anxiety   . BPH (benign prostatic hypertrophy)   . BXO (balanitis xerotica obliterans)   . Depression   . ED (erectile dysfunction)   . Heartburn   . History of kidney stones   . Hypogonadism in male     Surgical History: Past Surgical History:  Procedure Laterality Date  . CHOLECYSTECTOMY    . CORNEAL TRANSPLANT  02/04/2013  . HERNIA REPAIR      Home Medications:  Allergies as of  04/23/2019      Reactions   Orphenadrine Citrate Other (See Comments)   Blurred vision extremely      Medication List       Accurate as of April 23, 2019  9:56 AM. If you have any questions, ask your nurse or doctor.        STOP taking these medications   oseltamivir 75 MG capsule Commonly known as: Tamiflu Stopped by: Zara Council, PA-C     TAKE these medications   amphetamine-dextroamphetamine 15 MG 24 hr capsule Commonly known as: ADDERALL XR   buPROPion 300 MG 24 hr tablet Commonly known as: WELLBUTRIN XL Take by mouth.   sildenafil 20 MG tablet Commonly known as: REVATIO Take 3 to 5 tablets two hours prior to intercourse What changed: See the new instructions. Changed by: Zara Council, PA-C       Allergies:  Allergies  Allergen Reactions  . Orphenadrine Citrate Other (See Comments)    Blurred vision extremely    Family History: Family History  Problem Relation Age of Onset  . Cancer Unknown   . Kidney disease Neg Hx   . Prostate cancer Neg Hx     Social History:  reports that he has never smoked. He has never used smokeless tobacco. He reports current alcohol use. He reports that he does not use drugs.  ROS: UROLOGY Frequent Urination?: Yes Hard to postpone urination?: Yes Burning/pain with urination?: No Get up at night to urinate?: No Leakage of urine?: No Urine stream starts and stops?: No Trouble starting stream?: No Do you have to strain to urinate?: No Blood in urine?: No Urinary tract infection?: No Sexually transmitted disease?: No Injury to kidneys or bladder?: No Painful intercourse?: No Weak stream?: No Erection problems?: Yes Penile pain?: Yes  Gastrointestinal Nausea?: No Vomiting?: No Indigestion/heartburn?: No Diarrhea?: No Constipation?: No  Constitutional Fever: No Night sweats?: No Weight loss?: No Fatigue?: No  Skin Skin rash/lesions?: No Itching?: No  Eyes Blurred vision?: No Double vision?: No   Ears/Nose/Throat Sore throat?: No Sinus problems?: No  Hematologic/Lymphatic Swollen glands?: No Easy bruising?: No  Cardiovascular Leg swelling?: No Chest pain?: No  Respiratory Cough?: No Shortness of breath?: No  Endocrine Excessive thirst?: No  Musculoskeletal Back pain?: No Joint  pain?: No  Neurological Headaches?: No Dizziness?: No  Psychologic Depression?: No Anxiety?: No  Physical Exam: BP 117/69   Pulse 68   Ht 6\' 4"  (1.93 m)   Wt 175 lb (79.4 kg)   BMI 21.30 kg/m   Constitutional:  Well nourished. Alert and oriented, No acute distress. HEENT: [place] AT, moist mucus membranes.  Trachea midline, no masses. Cardiovascular: No clubbing, cyanosis, or edema. Respiratory: Normal respiratory effort, no increased work of breathing. GI: Abdomen is soft, non tender, non distended, no abdominal masses. Liver and spleen not palpable.  No hernias appreciated.  Stool sample for occult testing is not indicated.   GU: No CVA tenderness.  No bladder fullness or masses.  Patient with circumcised phallus.  Urethral meatus is patent.  No penile discharge. No penile lesions or rashes. Scrotum without lesions, cysts, rashes and/or edema.  Testicles are located scrotally bilaterally. No masses are appreciated in the testicles. Left and right epididymis are normal. Rectal: Patient with  normal sphincter tone. Anus and perineum without scarring or rashes. No rectal masses are appreciated. Prostate is approximately 45 grams, could only palpate the apex and midportion of the glands, no nodules are appreciated. Skin: No rashes, bruises or suspicious lesions. Neurologic: Grossly intact, no focal deficits, moving all 4 extremities. Psychiatric: Normal mood and affect.   Laboratory Data: PSA History  0.8 ng/mL on 02/10/2013  0.5 ng/mL on 02/10/2014  0.7 ng/mL on 02/14/2015  0.5 ng/mL on 03/05/2016  0.6 ng/mL on 03/10/2017  0.6 ng/mL on 04/22/2018  Urinalysis Negative.  See Epic.   I have reviewed the labs.  Assessment & Plan:    1. Erectile dysfunction SHIM score is 19,  it improved Script for sildenafil refilled  RTC in 12 months for repeat SHIM score and exam   2. BPH with LUTS IPSS score is 13/3, it is worsening - UA is negative Continue conservative management, avoiding bladder irritants and timed voiding's Most bothersome symptoms are frequency, urgency and a weak urinary stream - he will try to drink more water and eliminate sugary drinks from his diet and see if his symptoms improve RTC in 12 months for IPSS, PSA and exam   3.  Right breast lump Negative mammogram 04/2018  Return in about 1 year (around 04/22/2020) for IPSS, SHIM, PSA and exam.  These notes generated with voice recognition software. I apologize for typographical errors.  Zara Council, PA-C  [facility] [address] [place], [place] [address] [phone]

## 2019-04-23 ENCOUNTER — Other Ambulatory Visit: Payer: Self-pay

## 2019-04-23 ENCOUNTER — Ambulatory Visit: Payer: 59 | Admitting: Urology

## 2019-04-23 ENCOUNTER — Encounter: Payer: Self-pay | Admitting: Urology

## 2019-04-23 VITALS — BP 117/69 | HR 68 | Ht 76.0 in | Wt 175.0 lb

## 2019-04-23 DIAGNOSIS — N529 Male erectile dysfunction, unspecified: Secondary | ICD-10-CM | POA: Diagnosis not present

## 2019-04-23 DIAGNOSIS — N401 Enlarged prostate with lower urinary tract symptoms: Secondary | ICD-10-CM | POA: Diagnosis not present

## 2019-04-23 DIAGNOSIS — N138 Other obstructive and reflux uropathy: Secondary | ICD-10-CM

## 2019-04-23 DIAGNOSIS — Z131 Encounter for screening for diabetes mellitus: Secondary | ICD-10-CM

## 2019-04-23 LAB — URINALYSIS, COMPLETE
Bilirubin, UA: NEGATIVE
Glucose, UA: NEGATIVE
Ketones, UA: NEGATIVE
Leukocytes,UA: NEGATIVE
Nitrite, UA: NEGATIVE
Protein,UA: NEGATIVE
Specific Gravity, UA: >1.03 — ABNORMAL HIGH (ref 1.005–1.030)
Urobilinogen, Ur: 0.2 mg/dL (ref 0.2–1.0)
pH, UA: 5 (ref 5.0–7.5)

## 2019-04-23 LAB — MICROSCOPIC EXAMINATION
Bacteria, UA: NONE SEEN
RBC, Urine: NONE SEEN /hpf (ref 0–2)

## 2019-04-23 MED ORDER — SILDENAFIL CITRATE 20 MG PO TABS: ORAL_TABLET | ORAL | 6 refills | Status: DC

## 2019-04-24 LAB — HEMOGLOBIN A1C
Est. average glucose Bld gHb Est-mCnc: 117 mg/dL
Hgb A1c MFr Bld: 5.7 % — ABNORMAL HIGH (ref 4.8–5.6)

## 2019-04-24 LAB — PSA: Prostate Specific Ag, Serum: 0.7 ng/mL (ref 0.0–4.0)

## 2019-04-27 DIAGNOSIS — F902 Attention-deficit hyperactivity disorder, combined type: Secondary | ICD-10-CM | POA: Diagnosis not present

## 2019-05-11 DIAGNOSIS — F902 Attention-deficit hyperactivity disorder, combined type: Secondary | ICD-10-CM | POA: Diagnosis not present

## 2019-05-19 DIAGNOSIS — F902 Attention-deficit hyperactivity disorder, combined type: Secondary | ICD-10-CM | POA: Diagnosis not present

## 2019-05-25 DIAGNOSIS — F902 Attention-deficit hyperactivity disorder, combined type: Secondary | ICD-10-CM | POA: Diagnosis not present

## 2019-06-02 DIAGNOSIS — F902 Attention-deficit hyperactivity disorder, combined type: Secondary | ICD-10-CM | POA: Diagnosis not present

## 2019-06-09 DIAGNOSIS — F902 Attention-deficit hyperactivity disorder, combined type: Secondary | ICD-10-CM | POA: Diagnosis not present

## 2019-06-23 DIAGNOSIS — F902 Attention-deficit hyperactivity disorder, combined type: Secondary | ICD-10-CM | POA: Diagnosis not present

## 2019-07-14 DIAGNOSIS — F902 Attention-deficit hyperactivity disorder, combined type: Secondary | ICD-10-CM | POA: Diagnosis not present

## 2019-07-21 DIAGNOSIS — F902 Attention-deficit hyperactivity disorder, combined type: Secondary | ICD-10-CM | POA: Diagnosis not present

## 2019-08-04 DIAGNOSIS — F902 Attention-deficit hyperactivity disorder, combined type: Secondary | ICD-10-CM | POA: Diagnosis not present

## 2019-08-09 DIAGNOSIS — Z9889 Other specified postprocedural states: Secondary | ICD-10-CM | POA: Diagnosis not present

## 2019-08-09 DIAGNOSIS — H5203 Hypermetropia, bilateral: Secondary | ICD-10-CM | POA: Diagnosis not present

## 2019-08-09 DIAGNOSIS — H524 Presbyopia: Secondary | ICD-10-CM | POA: Diagnosis not present

## 2019-08-09 DIAGNOSIS — H179 Unspecified corneal scar and opacity: Secondary | ICD-10-CM | POA: Diagnosis not present

## 2019-09-07 DIAGNOSIS — F902 Attention-deficit hyperactivity disorder, combined type: Secondary | ICD-10-CM | POA: Diagnosis not present

## 2019-09-13 DIAGNOSIS — F902 Attention-deficit hyperactivity disorder, combined type: Secondary | ICD-10-CM | POA: Diagnosis not present

## 2019-09-20 DIAGNOSIS — F902 Attention-deficit hyperactivity disorder, combined type: Secondary | ICD-10-CM | POA: Diagnosis not present

## 2019-10-07 DIAGNOSIS — F902 Attention-deficit hyperactivity disorder, combined type: Secondary | ICD-10-CM | POA: Diagnosis not present

## 2019-10-12 DIAGNOSIS — F902 Attention-deficit hyperactivity disorder, combined type: Secondary | ICD-10-CM | POA: Diagnosis not present

## 2019-10-19 DIAGNOSIS — F902 Attention-deficit hyperactivity disorder, combined type: Secondary | ICD-10-CM | POA: Diagnosis not present

## 2019-11-02 DIAGNOSIS — F902 Attention-deficit hyperactivity disorder, combined type: Secondary | ICD-10-CM | POA: Diagnosis not present

## 2019-11-16 DIAGNOSIS — F902 Attention-deficit hyperactivity disorder, combined type: Secondary | ICD-10-CM | POA: Diagnosis not present

## 2019-11-23 DIAGNOSIS — F902 Attention-deficit hyperactivity disorder, combined type: Secondary | ICD-10-CM | POA: Diagnosis not present

## 2019-12-07 DIAGNOSIS — F902 Attention-deficit hyperactivity disorder, combined type: Secondary | ICD-10-CM | POA: Diagnosis not present

## 2019-12-09 DIAGNOSIS — F902 Attention-deficit hyperactivity disorder, combined type: Secondary | ICD-10-CM | POA: Diagnosis not present

## 2019-12-14 DIAGNOSIS — F902 Attention-deficit hyperactivity disorder, combined type: Secondary | ICD-10-CM | POA: Diagnosis not present

## 2019-12-21 DIAGNOSIS — F902 Attention-deficit hyperactivity disorder, combined type: Secondary | ICD-10-CM | POA: Diagnosis not present

## 2019-12-28 DIAGNOSIS — F902 Attention-deficit hyperactivity disorder, combined type: Secondary | ICD-10-CM | POA: Diagnosis not present

## 2020-01-04 DIAGNOSIS — F902 Attention-deficit hyperactivity disorder, combined type: Secondary | ICD-10-CM | POA: Diagnosis not present

## 2020-01-11 DIAGNOSIS — F902 Attention-deficit hyperactivity disorder, combined type: Secondary | ICD-10-CM | POA: Diagnosis not present

## 2020-01-14 ENCOUNTER — Other Ambulatory Visit: Payer: Self-pay | Admitting: Urology

## 2020-01-14 DIAGNOSIS — N529 Male erectile dysfunction, unspecified: Secondary | ICD-10-CM

## 2020-01-18 DIAGNOSIS — F902 Attention-deficit hyperactivity disorder, combined type: Secondary | ICD-10-CM | POA: Diagnosis not present

## 2020-02-01 DIAGNOSIS — F902 Attention-deficit hyperactivity disorder, combined type: Secondary | ICD-10-CM | POA: Diagnosis not present

## 2020-02-15 DIAGNOSIS — F902 Attention-deficit hyperactivity disorder, combined type: Secondary | ICD-10-CM | POA: Diagnosis not present

## 2020-02-29 DIAGNOSIS — F325 Major depressive disorder, single episode, in full remission: Secondary | ICD-10-CM | POA: Diagnosis not present

## 2020-02-29 DIAGNOSIS — N401 Enlarged prostate with lower urinary tract symptoms: Secondary | ICD-10-CM | POA: Diagnosis not present

## 2020-02-29 DIAGNOSIS — K219 Gastro-esophageal reflux disease without esophagitis: Secondary | ICD-10-CM | POA: Diagnosis not present

## 2020-02-29 DIAGNOSIS — F909 Attention-deficit hyperactivity disorder, unspecified type: Secondary | ICD-10-CM | POA: Diagnosis not present

## 2020-02-29 DIAGNOSIS — F902 Attention-deficit hyperactivity disorder, combined type: Secondary | ICD-10-CM | POA: Diagnosis not present

## 2020-02-29 DIAGNOSIS — Z0000 Encounter for general adult medical examination without abnormal findings: Secondary | ICD-10-CM | POA: Diagnosis not present

## 2020-03-01 DIAGNOSIS — Z125 Encounter for screening for malignant neoplasm of prostate: Secondary | ICD-10-CM | POA: Diagnosis not present

## 2020-03-01 DIAGNOSIS — N401 Enlarged prostate with lower urinary tract symptoms: Secondary | ICD-10-CM | POA: Diagnosis not present

## 2020-03-01 DIAGNOSIS — K219 Gastro-esophageal reflux disease without esophagitis: Secondary | ICD-10-CM | POA: Diagnosis not present

## 2020-03-01 DIAGNOSIS — F909 Attention-deficit hyperactivity disorder, unspecified type: Secondary | ICD-10-CM | POA: Diagnosis not present

## 2020-03-01 DIAGNOSIS — F325 Major depressive disorder, single episode, in full remission: Secondary | ICD-10-CM | POA: Diagnosis not present

## 2020-03-01 DIAGNOSIS — N138 Other obstructive and reflux uropathy: Secondary | ICD-10-CM | POA: Diagnosis not present

## 2020-03-01 DIAGNOSIS — N529 Male erectile dysfunction, unspecified: Secondary | ICD-10-CM | POA: Diagnosis not present

## 2020-03-14 DIAGNOSIS — F902 Attention-deficit hyperactivity disorder, combined type: Secondary | ICD-10-CM | POA: Diagnosis not present

## 2020-03-21 DIAGNOSIS — F902 Attention-deficit hyperactivity disorder, combined type: Secondary | ICD-10-CM | POA: Diagnosis not present

## 2020-03-30 DIAGNOSIS — F902 Attention-deficit hyperactivity disorder, combined type: Secondary | ICD-10-CM | POA: Diagnosis not present

## 2020-04-21 ENCOUNTER — Ambulatory Visit: Payer: 59 | Admitting: Urology

## 2020-05-16 DIAGNOSIS — F902 Attention-deficit hyperactivity disorder, combined type: Secondary | ICD-10-CM | POA: Diagnosis not present

## 2020-05-22 ENCOUNTER — Ambulatory Visit: Payer: 59 | Admitting: Dermatology

## 2020-05-23 DIAGNOSIS — F902 Attention-deficit hyperactivity disorder, combined type: Secondary | ICD-10-CM | POA: Diagnosis not present

## 2020-05-30 DIAGNOSIS — F902 Attention-deficit hyperactivity disorder, combined type: Secondary | ICD-10-CM | POA: Diagnosis not present

## 2020-06-01 DIAGNOSIS — R300 Dysuria: Secondary | ICD-10-CM | POA: Diagnosis not present

## 2020-06-01 DIAGNOSIS — N390 Urinary tract infection, site not specified: Secondary | ICD-10-CM | POA: Diagnosis not present

## 2020-06-15 DIAGNOSIS — F902 Attention-deficit hyperactivity disorder, combined type: Secondary | ICD-10-CM | POA: Diagnosis not present

## 2020-06-27 DIAGNOSIS — F902 Attention-deficit hyperactivity disorder, combined type: Secondary | ICD-10-CM | POA: Diagnosis not present

## 2020-07-25 DIAGNOSIS — F902 Attention-deficit hyperactivity disorder, combined type: Secondary | ICD-10-CM | POA: Diagnosis not present

## 2020-08-02 DIAGNOSIS — F902 Attention-deficit hyperactivity disorder, combined type: Secondary | ICD-10-CM | POA: Diagnosis not present

## 2020-08-07 DIAGNOSIS — F902 Attention-deficit hyperactivity disorder, combined type: Secondary | ICD-10-CM | POA: Diagnosis not present

## 2020-08-23 DIAGNOSIS — F902 Attention-deficit hyperactivity disorder, combined type: Secondary | ICD-10-CM | POA: Diagnosis not present

## 2020-08-29 ENCOUNTER — Other Ambulatory Visit: Payer: Self-pay | Admitting: Adult Health Nurse Practitioner

## 2020-08-30 ENCOUNTER — Other Ambulatory Visit: Payer: Self-pay | Admitting: Infectious Diseases

## 2020-08-30 DIAGNOSIS — N529 Male erectile dysfunction, unspecified: Secondary | ICD-10-CM | POA: Diagnosis not present

## 2020-08-30 DIAGNOSIS — F909 Attention-deficit hyperactivity disorder, unspecified type: Secondary | ICD-10-CM | POA: Diagnosis not present

## 2020-08-30 DIAGNOSIS — F325 Major depressive disorder, single episode, in full remission: Secondary | ICD-10-CM | POA: Diagnosis not present

## 2020-08-31 ENCOUNTER — Ambulatory Visit: Payer: 59 | Admitting: Dermatology

## 2020-08-31 ENCOUNTER — Other Ambulatory Visit: Payer: Self-pay

## 2020-08-31 DIAGNOSIS — Z1283 Encounter for screening for malignant neoplasm of skin: Secondary | ICD-10-CM | POA: Diagnosis not present

## 2020-08-31 DIAGNOSIS — Z808 Family history of malignant neoplasm of other organs or systems: Secondary | ICD-10-CM | POA: Diagnosis not present

## 2020-08-31 DIAGNOSIS — D229 Melanocytic nevi, unspecified: Secondary | ICD-10-CM

## 2020-08-31 DIAGNOSIS — L814 Other melanin hyperpigmentation: Secondary | ICD-10-CM

## 2020-08-31 DIAGNOSIS — L918 Other hypertrophic disorders of the skin: Secondary | ICD-10-CM

## 2020-08-31 DIAGNOSIS — L578 Other skin changes due to chronic exposure to nonionizing radiation: Secondary | ICD-10-CM

## 2020-08-31 DIAGNOSIS — D1800 Hemangioma unspecified site: Secondary | ICD-10-CM | POA: Diagnosis not present

## 2020-08-31 DIAGNOSIS — L821 Other seborrheic keratosis: Secondary | ICD-10-CM

## 2020-08-31 NOTE — Progress Notes (Signed)
° °  New Patient Visit  Subjective  Dustin Little is a 59 y.o. male who presents for the following: Annual Exam (New pt present for TBSE, Fx hx of MM). Pt report no moles he is concerned about today.  The patient presents for Total-Body Skin Exam (TBSE) for skin cancer screening and mole check.  The following portions of the chart were reviewed this encounter and updated as appropriate:  Tobacco   Allergies   Meds   Problems   Med Hx   Surg Hx   Fam Hx      Review of Systems:  No other skin or systemic complaints except as noted in HPI or Assessment and Plan.  Objective  Well appearing patient in no apparent distress; mood and affect are within normal limits.  A full examination was performed including scalp, head, eyes, ears, nose, lips, neck, chest, axillae, abdomen, back, buttocks, bilateral upper extremities, bilateral lower extremities, hands, feet, fingers, toes, fingernails, and toenails. All findings within normal limits unless otherwise noted below.  Objective  L groin: Fleshy, skin-colored pedunculated papules.    Objective  unknown: Clear skin    Assessment & Plan  Skin tag L groin  Pt will pay out of pocket $115 for snip removal   Consent signed to pt will pay out of pocket to removal non covered skin tags   Destruction of lesion - L groin  Destruction method comment:  Snip removal Informed consent: discussed and consent obtained   Timeout:  patient name, date of birth, surgical site, and procedure verified Outcome: patient tolerated procedure well with no complications   Post-procedure details: wound care instructions given    Family history of melanoma unknown  Benign-appearing.  Observation.  Call clinic for new or changing moles.  Recommend daily use of broad spectrum spf 30+ sunscreen to sun-exposed areas.     Lentigines - Scattered tan macules - Discussed due to sun exposure - Benign, observe - Call for any changes  Seborrheic Keratoses -  Stuck-on, waxy, tan-brown papules and plaques  - Discussed benign etiology and prognosis. - Observe - Call for any changes  Melanocytic Nevi - Tan-brown and/or pink-flesh-colored symmetric macules and papules - Benign appearing on exam today - Observation - Call clinic for new or changing moles - Recommend daily use of broad spectrum spf 30+ sunscreen to sun-exposed areas.   Hemangiomas - Red papules - Discussed benign nature - Observe - Call for any changes  Actinic Damage - diffuse scaly erythematous macules with underlying dyspigmentation - Recommend daily broad spectrum sunscreen SPF 30+ to sun-exposed areas, reapply every 2 hours as needed.  - Call for new or changing lesions.  Acrochordons (Skin Tags) Inner thighs - Fleshy, skin-colored pedunculated papules - Benign appearing.  - Observe. - If desired, they can be removed with an in office procedure that is not covered by insurance. - Please call the clinic if you notice any new or changing lesions.  Skin cancer screening performed today.  Return in about 1 year (around 08/31/2021) for TBSE.  IMarye Round, CMA, am acting as scribe for Sarina Ser, MD .  Documentation: I have reviewed the above documentation for accuracy and completeness, and I agree with the above.  Sarina Ser, MD

## 2020-09-01 ENCOUNTER — Encounter: Payer: Self-pay | Admitting: Dermatology

## 2020-09-19 DIAGNOSIS — F902 Attention-deficit hyperactivity disorder, combined type: Secondary | ICD-10-CM | POA: Diagnosis not present

## 2020-09-21 ENCOUNTER — Other Ambulatory Visit: Payer: Self-pay | Admitting: Adult Health Nurse Practitioner

## 2020-09-21 DIAGNOSIS — H179 Unspecified corneal scar and opacity: Secondary | ICD-10-CM | POA: Diagnosis not present

## 2020-10-10 ENCOUNTER — Other Ambulatory Visit: Payer: Self-pay | Admitting: Infectious Diseases

## 2020-10-10 DIAGNOSIS — R300 Dysuria: Secondary | ICD-10-CM | POA: Diagnosis not present

## 2020-10-10 DIAGNOSIS — N138 Other obstructive and reflux uropathy: Secondary | ICD-10-CM | POA: Diagnosis not present

## 2020-10-10 DIAGNOSIS — N390 Urinary tract infection, site not specified: Secondary | ICD-10-CM | POA: Diagnosis not present

## 2020-10-10 DIAGNOSIS — N401 Enlarged prostate with lower urinary tract symptoms: Secondary | ICD-10-CM | POA: Diagnosis not present

## 2020-10-17 ENCOUNTER — Other Ambulatory Visit: Payer: Self-pay | Admitting: Internal Medicine

## 2020-10-17 ENCOUNTER — Ambulatory Visit: Payer: 59 | Attending: Internal Medicine

## 2020-10-17 DIAGNOSIS — Z23 Encounter for immunization: Secondary | ICD-10-CM

## 2020-10-17 NOTE — Progress Notes (Signed)
   Covid-19 Vaccination Clinic  Name:  Dustin Little    MRN: [id number] DOB: 07/06/1961  10/17/2020  [name] was observed post Covid-19 immunization for 15 minutes without incident. He was provided with Vaccine Information Sheet and instruction to access the V-Safe system.   [name] was instructed to call 911 with any severe reactions post vaccine: Marland Kitchen Difficulty breathing  . Swelling of face and throat  . A fast heartbeat  . A bad rash all over body  . Dizziness and weakness   Immunizations Administered    Name Date Dose VIS Date Route   Pfizer COVID-19 Vaccine 10/17/2020  9:26 AM 0.3 mL 08/23/2020 Intramuscular   Manufacturer: [place]   Lot: [id number]   [place]: [id number]

## 2020-11-14 ENCOUNTER — Other Ambulatory Visit: Payer: Self-pay | Admitting: Infectious Diseases

## 2020-11-14 DIAGNOSIS — F909 Attention-deficit hyperactivity disorder, unspecified type: Secondary | ICD-10-CM | POA: Diagnosis not present

## 2020-11-14 DIAGNOSIS — F325 Major depressive disorder, single episode, in full remission: Secondary | ICD-10-CM | POA: Diagnosis not present

## 2020-11-14 DIAGNOSIS — N529 Male erectile dysfunction, unspecified: Secondary | ICD-10-CM | POA: Diagnosis not present

## 2020-11-14 DIAGNOSIS — N401 Enlarged prostate with lower urinary tract symptoms: Secondary | ICD-10-CM | POA: Diagnosis not present

## 2020-11-16 DIAGNOSIS — F902 Attention-deficit hyperactivity disorder, combined type: Secondary | ICD-10-CM | POA: Diagnosis not present

## 2020-11-28 DIAGNOSIS — F902 Attention-deficit hyperactivity disorder, combined type: Secondary | ICD-10-CM | POA: Diagnosis not present

## 2020-12-25 DIAGNOSIS — M9903 Segmental and somatic dysfunction of lumbar region: Secondary | ICD-10-CM | POA: Diagnosis not present

## 2020-12-25 DIAGNOSIS — M7918 Myalgia, other site: Secondary | ICD-10-CM | POA: Diagnosis not present

## 2020-12-25 DIAGNOSIS — M5451 Vertebrogenic low back pain: Secondary | ICD-10-CM | POA: Diagnosis not present

## 2020-12-25 DIAGNOSIS — M5416 Radiculopathy, lumbar region: Secondary | ICD-10-CM | POA: Diagnosis not present

## 2020-12-25 DIAGNOSIS — M9904 Segmental and somatic dysfunction of sacral region: Secondary | ICD-10-CM | POA: Diagnosis not present

## 2020-12-25 DIAGNOSIS — M62830 Muscle spasm of back: Secondary | ICD-10-CM | POA: Diagnosis not present

## 2020-12-26 DIAGNOSIS — M9903 Segmental and somatic dysfunction of lumbar region: Secondary | ICD-10-CM | POA: Diagnosis not present

## 2020-12-26 DIAGNOSIS — M9904 Segmental and somatic dysfunction of sacral region: Secondary | ICD-10-CM | POA: Diagnosis not present

## 2020-12-26 DIAGNOSIS — M5451 Vertebrogenic low back pain: Secondary | ICD-10-CM | POA: Diagnosis not present

## 2020-12-26 DIAGNOSIS — M5416 Radiculopathy, lumbar region: Secondary | ICD-10-CM | POA: Diagnosis not present

## 2020-12-26 DIAGNOSIS — M62830 Muscle spasm of back: Secondary | ICD-10-CM | POA: Diagnosis not present

## 2020-12-26 DIAGNOSIS — M7918 Myalgia, other site: Secondary | ICD-10-CM | POA: Diagnosis not present

## 2020-12-27 ENCOUNTER — Other Ambulatory Visit: Payer: Self-pay | Admitting: Infectious Diseases

## 2020-12-27 DIAGNOSIS — M9904 Segmental and somatic dysfunction of sacral region: Secondary | ICD-10-CM | POA: Diagnosis not present

## 2020-12-27 DIAGNOSIS — M9903 Segmental and somatic dysfunction of lumbar region: Secondary | ICD-10-CM | POA: Diagnosis not present

## 2020-12-27 DIAGNOSIS — M7918 Myalgia, other site: Secondary | ICD-10-CM | POA: Diagnosis not present

## 2020-12-27 DIAGNOSIS — M62830 Muscle spasm of back: Secondary | ICD-10-CM | POA: Diagnosis not present

## 2020-12-27 DIAGNOSIS — M5451 Vertebrogenic low back pain: Secondary | ICD-10-CM | POA: Diagnosis not present

## 2020-12-27 DIAGNOSIS — M5416 Radiculopathy, lumbar region: Secondary | ICD-10-CM | POA: Diagnosis not present

## 2021-02-06 ENCOUNTER — Other Ambulatory Visit: Payer: Self-pay

## 2021-02-06 MED FILL — Sildenafil Citrate Tab 20 MG: ORAL | 30 days supply | Qty: 150 | Fill #0 | Status: AC

## 2021-02-26 ENCOUNTER — Other Ambulatory Visit: Payer: Self-pay

## 2021-02-26 MED ORDER — AMPHETAMINE-DEXTROAMPHET ER 15 MG PO CP24
ORAL_CAPSULE | ORAL | 0 refills | Status: DC
  Filled 2021-02-26: qty 90, 30d supply, fill #0

## 2021-03-12 ENCOUNTER — Ambulatory Visit: Payer: 59 | Admitting: Dermatology

## 2021-03-12 ENCOUNTER — Other Ambulatory Visit: Payer: Self-pay

## 2021-03-12 DIAGNOSIS — L821 Other seborrheic keratosis: Secondary | ICD-10-CM | POA: Diagnosis not present

## 2021-03-12 DIAGNOSIS — R21 Rash and other nonspecific skin eruption: Secondary | ICD-10-CM

## 2021-03-12 DIAGNOSIS — L820 Inflamed seborrheic keratosis: Secondary | ICD-10-CM | POA: Diagnosis not present

## 2021-03-12 DIAGNOSIS — D485 Neoplasm of uncertain behavior of skin: Secondary | ICD-10-CM

## 2021-03-12 NOTE — Patient Instructions (Addendum)
Cryotherapy Aftercare  . Wash gently with soap and water everyday.   . Apply Vaseline and Band-Aid daily until healed.  Wound Care Instructions  1. Cleanse wound gently with soap and water once a day then pat dry with clean gauze. Apply a thing coat of Petrolatum (petroleum jelly, "Vaseline") over the wound (unless you have an allergy to this). We recommend that you use a new, sterile tube of Vaseline. Do not pick or remove scabs. Do not remove the yellow or white "healing tissue" from the base of the wound.  2. Cover the wound with fresh, clean, nonstick gauze and secure with paper tape. You may use Band-Aids in place of gauze and tape if the would is small enough, but would recommend trimming much of the tape off as there is often too much. Sometimes Band-Aids can irritate the skin.  3. You should call the office for your biopsy report after 1 week if you have not already been contacted.  4. If you experience any problems, such as abnormal amounts of bleeding, swelling, significant bruising, significant pain, or evidence of infection, please call the office immediately.    Melanoma ABCDEs  Melanoma is the most dangerous type of skin cancer, and is the leading cause of death from skin disease.  You are more likely to develop melanoma if you:  Have light-colored skin, light-colored eyes, or red or blond hair  Spend a lot of time in the sun  Tan regularly, either outdoors or in a tanning bed  Have had blistering sunburns, especially during childhood  Have a close family member who has had a melanoma  Have atypical moles or large birthmarks  Early detection of melanoma is key since treatment is typically straightforward and cure rates are extremely high if we catch it early.   The first sign of melanoma is often a change in a mole or a new dark spot.  The ABCDE system is a way of remembering the signs of melanoma.  A for asymmetry:  The two halves do not match. B for border:  The  edges of the growth are irregular. C for color:  A mixture of colors are present instead of an even brown color. D for diameter:  Melanomas are usually (but not always) greater than 6mm - the size of a pencil eraser. E for evolution:  The spot keeps changing in size, shape, and color.  Please check your skin once per month between visits. You can use a small mirror in front and a large mirror behind you to keep an eye on the back side or your body.   If you see any new or changing lesions before your next follow-up, please call to schedule a visit.  Please continue daily skin protection including broad spectrum sunscreen SPF 30+ to sun-exposed areas, reapplying every 2 hours as needed when you're outdoors.   If you have any questions or concerns for your doctor, please call our main line at [phone] and press option 4 to reach your doctor's medical assistant. If no one answers, please leave a voicemail as directed and we will return your call as soon as possible. Messages left after 4 pm will be answered the following business day.   You may also send us a message via MyChart. We typically respond to MyChart messages within 1-2 business days.  For prescription refills, please ask your pharmacy to contact our office. Our fax number is [phone].  If you have an urgent issue when the clinic   clinic is closed that cannot wait until the next business day, you can page your doctor at the number below.    Please note that while we do our best to be available for urgent issues outside of office hours, we are not available 24/7.   If you have an urgent issue and are unable to reach [place], you may choose to seek medical care at your doctor's office, retail clinic, urgent care center, or emergency room.  If you have a medical emergency, please immediately call 911 or go to the emergency department.  Pager Numbers  - Dr. Nehemiah Massed: [phone]  - Dr. Laurence Ferrari: [phone]  - Dr. Nicole Kindred:  [phone]  In the event of inclement weather, please call our main line at [phone] for an update on the status of any delays or closures.  Dermatology Medication Tips: Please keep the boxes that topical medications come in in order to help keep track of the instructions about where and how to use these. Pharmacies typically print the medication instructions only on the boxes and not directly on the medication tubes.   If your medication is too expensive, please contact our office at [phone] option 4 or send [place] a message through [place].   We are unable to tell what your co-pay for medications will be in advance as this is different depending on your insurance coverage. However, we may be able to find a substitute medication at lower cost or fill out paperwork to get insurance to cover a needed medication.   If a prior authorization is required to get your medication covered by your insurance company, please allow [place] 1-2 business days to complete this process.  Drug prices often vary depending on where the prescription is filled and some pharmacies may offer cheaper prices.  The website www.goodrx.com contains coupons for medications through different pharmacies. The prices here do not account for what the cost may be with help from insurance (it may be cheaper with your insurance), but the website can give you the price if you did not use any insurance.  - You can print the associated coupon and take it with your prescription to the pharmacy.  - You may also stop by our office during regular business hours and pick up a GoodRx coupon card.  - If you need your prescription sent electronically to a different pharmacy, notify our office through [facility] or by phone at [phone] option 4.

## 2021-03-12 NOTE — Progress Notes (Signed)
   Follow-Up Visit   Subjective  Dustin Little is a 60 y.o. male who presents for the following: Nevus (Patient has a spot at back that he noticed about 6 months ago. Patient's wife thinks it's been there for a long time but patient would like it checked. He has a sister that passed away at 59 years old from MM. ) and Skin Problem (Patient also has some discoloration at genitals that he has noticed in the last year and would like checked to make sure it is ok. History of growing and changing. Patient has hx of BXO. ). No hx of genital warts.   The following portions of the chart were reviewed this encounter and updated as appropriate:   Tobacco  Allergies  Meds  Problems  Med Hx  Surg Hx  Fam Hx     Review of Systems:  No other skin or systemic complaints except as noted in HPI or Assessment and Plan.  Objective  Well appearing patient in no apparent distress; mood and affect are within normal limits.  A focused examination was performed including back, genitals. Relevant physical exam findings are noted in the Assessment and Plan.  Objective  Left scapula: 0.6cm brown papule   Objective  underside penis proximal to corona (5): Erythematous keratotic or waxy stuck-on papule or plaque.   Objective  Pubic: Currently in remission   Assessment & Plan  Neoplasm of uncertain behavior of skin Left scapula Epidermal / dermal shaving  Lesion diameter (cm):  0.6 Informed consent: discussed and consent obtained   Timeout: patient name, date of birth, surgical site, and procedure verified   Procedure prep:  Patient was prepped and draped in usual sterile fashion Prep type:  Isopropyl alcohol Anesthesia: the lesion was anesthetized in a standard fashion   Anesthetic:  1% lidocaine w/ epinephrine 1-100,000 buffered w/ 8.4% NaHCO3 Instrument used: flexible razor blade   Hemostasis achieved with: pressure, aluminum chloride and electrodesiccation   Outcome: patient tolerated  procedure well   Post-procedure details: sterile dressing applied and wound care instructions given   Dressing type: bandage and petrolatum    Specimen 1 - Surgical pathology Differential Diagnosis: SK vs Nevus r/o Dysplasia  Check Margins: No 0.6cm brown papule  Inflamed seborrheic keratosis (5) (vs dyschromia) underside penis proximal to corona Destruction of lesion - underside penis proximal to corona Complexity: simple   Destruction method: cryotherapy   Informed consent: discussed and consent obtained   Timeout:  patient name, date of birth, surgical site, and procedure verified Lesion destroyed using liquid nitrogen: Yes   Region frozen until ice ball extended beyond lesion: Yes   Outcome: patient tolerated procedure well with no complications   Post-procedure details: wound care instructions given    Rash Genital history of BXO (Balanitis Xerotica Obliterans) Currently in remission. Some pink mild patches vaguely evident in some areas where was in past. Observe.   Seborrheic Keratoses - Stuck-on, waxy, tan-brown papules and/or plaques  - Benign-appearing - Discussed benign etiology and prognosis. - Observe - Call for any changes  Return in about 3 months (around 06/12/2021) for ISK follow up.  Graciella Belton, RMA, am acting as scribe for Sarina Ser, MD . Documentation: I have reviewed the above documentation for accuracy and completeness, and I agree with the above.  Sarina Ser, MD

## 2021-03-13 ENCOUNTER — Encounter: Payer: Self-pay | Admitting: Dermatology

## 2021-03-15 ENCOUNTER — Telehealth: Payer: Self-pay

## 2021-03-15 NOTE — Telephone Encounter (Signed)
Advised patient of results/hd  

## 2021-03-15 NOTE — Telephone Encounter (Signed)
-----   Message from Ralene Bathe, MD sent at 03/15/2021 12:55 PM EDT ----- Diagnosis Skin , left scapula PIGMENTED SEBORRHEIC KERATOSIS  Benign keratosis No further treatment needed

## 2021-03-21 ENCOUNTER — Other Ambulatory Visit: Payer: Self-pay

## 2021-03-21 MED FILL — Sildenafil Citrate Tab 20 MG: ORAL | 30 days supply | Qty: 150 | Fill #1 | Status: AC

## 2021-03-22 ENCOUNTER — Other Ambulatory Visit: Payer: Self-pay

## 2021-03-23 ENCOUNTER — Other Ambulatory Visit: Payer: Self-pay

## 2021-03-23 MED FILL — Bupropion HCl Tab ER 24HR 300 MG: ORAL | 90 days supply | Qty: 90 | Fill #0 | Status: AC

## 2021-03-26 ENCOUNTER — Other Ambulatory Visit: Payer: Self-pay

## 2021-03-26 MED ORDER — AMPHETAMINE-DEXTROAMPHET ER 15 MG PO CP24
ORAL_CAPSULE | ORAL | 0 refills | Status: DC
  Filled 2021-03-26: qty 90, 30d supply, fill #0

## 2021-04-19 MED FILL — Sildenafil Citrate Tab 20 MG: ORAL | 30 days supply | Qty: 150 | Fill #2 | Status: CN

## 2021-04-20 ENCOUNTER — Other Ambulatory Visit: Payer: Self-pay

## 2021-04-20 MED FILL — Sildenafil Citrate Tab 20 MG: ORAL | 30 days supply | Qty: 150 | Fill #2 | Status: AC

## 2021-04-21 ENCOUNTER — Other Ambulatory Visit: Payer: Self-pay

## 2021-04-25 ENCOUNTER — Other Ambulatory Visit: Payer: Self-pay

## 2021-04-25 MED ORDER — AMPHETAMINE-DEXTROAMPHET ER 15 MG PO CP24
ORAL_CAPSULE | ORAL | 0 refills | Status: DC
  Filled 2021-04-25 (×2): qty 90, 30d supply, fill #0

## 2021-05-14 ENCOUNTER — Other Ambulatory Visit: Payer: Self-pay

## 2021-05-14 DIAGNOSIS — Z0000 Encounter for general adult medical examination without abnormal findings: Secondary | ICD-10-CM | POA: Diagnosis not present

## 2021-05-14 DIAGNOSIS — N401 Enlarged prostate with lower urinary tract symptoms: Secondary | ICD-10-CM | POA: Diagnosis not present

## 2021-05-14 DIAGNOSIS — D649 Anemia, unspecified: Secondary | ICD-10-CM | POA: Diagnosis not present

## 2021-05-14 DIAGNOSIS — F325 Major depressive disorder, single episode, in full remission: Secondary | ICD-10-CM | POA: Diagnosis not present

## 2021-05-14 DIAGNOSIS — F909 Attention-deficit hyperactivity disorder, unspecified type: Secondary | ICD-10-CM | POA: Diagnosis not present

## 2021-05-14 DIAGNOSIS — N529 Male erectile dysfunction, unspecified: Secondary | ICD-10-CM | POA: Diagnosis not present

## 2021-05-14 MED ORDER — AMPHETAMINE-DEXTROAMPHET ER 15 MG PO CP24: ORAL_CAPSULE | ORAL | 0 refills | Status: DC

## 2021-05-14 MED ORDER — AMPHETAMINE-DEXTROAMPHET ER 15 MG PO CP24
ORAL_CAPSULE | ORAL | 0 refills | Status: DC
  Filled 2021-05-30: qty 30, 10d supply, fill #0

## 2021-05-30 ENCOUNTER — Other Ambulatory Visit: Payer: Self-pay

## 2021-05-30 MED ORDER — AMPHETAMINE-DEXTROAMPHET ER 15 MG PO CP24
ORAL_CAPSULE | ORAL | 0 refills | Status: DC
  Filled 2021-11-22: qty 30, 30d supply, fill #0

## 2021-05-30 MED FILL — Sildenafil Citrate Tab 20 MG: ORAL | 30 days supply | Qty: 150 | Fill #3 | Status: CN

## 2021-06-05 ENCOUNTER — Other Ambulatory Visit: Payer: Self-pay

## 2021-06-05 MED FILL — Sildenafil Citrate Tab 20 MG: ORAL | 30 days supply | Qty: 150 | Fill #3 | Status: CN

## 2021-06-07 ENCOUNTER — Other Ambulatory Visit: Payer: Self-pay

## 2021-06-08 ENCOUNTER — Other Ambulatory Visit: Payer: Self-pay

## 2021-06-08 MED ORDER — AMPHETAMINE-DEXTROAMPHET ER 15 MG PO CP24
ORAL_CAPSULE | ORAL | 0 refills | Status: DC
  Filled 2021-06-25: qty 90, 90d supply, fill #0
  Filled 2021-06-26: qty 90, 30d supply, fill #0

## 2021-06-08 MED ORDER — AMPHETAMINE-DEXTROAMPHET ER 15 MG PO CP24
ORAL_CAPSULE | ORAL | 0 refills | Status: DC
  Filled 2021-06-08 (×2): qty 60, 20d supply, fill #0

## 2021-06-08 MED ORDER — AMPHETAMINE-DEXTROAMPHET ER 15 MG PO CP24
ORAL_CAPSULE | ORAL | 0 refills | Status: DC
  Filled 2021-07-27: qty 90, 30d supply, fill #0

## 2021-06-08 MED FILL — Sildenafil Citrate Tab 20 MG: ORAL | 30 days supply | Qty: 150 | Fill #3 | Status: AC

## 2021-06-11 ENCOUNTER — Other Ambulatory Visit: Payer: Self-pay

## 2021-06-14 ENCOUNTER — Other Ambulatory Visit (HOSPITAL_COMMUNITY): Payer: Self-pay

## 2021-06-20 ENCOUNTER — Ambulatory Visit: Payer: 59 | Admitting: Dermatology

## 2021-06-21 ENCOUNTER — Other Ambulatory Visit: Payer: Self-pay

## 2021-06-25 ENCOUNTER — Other Ambulatory Visit: Payer: Self-pay

## 2021-06-26 ENCOUNTER — Other Ambulatory Visit: Payer: Self-pay

## 2021-06-26 MED FILL — Bupropion HCl Tab ER 24HR 300 MG: ORAL | 90 days supply | Qty: 90 | Fill #0 | Status: AC

## 2021-06-28 ENCOUNTER — Other Ambulatory Visit: Payer: Self-pay

## 2021-07-10 ENCOUNTER — Other Ambulatory Visit: Payer: Self-pay

## 2021-07-10 MED FILL — Sildenafil Citrate Tab 20 MG: ORAL | 30 days supply | Qty: 150 | Fill #4 | Status: AC

## 2021-07-27 ENCOUNTER — Other Ambulatory Visit: Payer: Self-pay

## 2021-08-16 MED FILL — Sildenafil Citrate Tab 20 MG: ORAL | 30 days supply | Qty: 150 | Fill #5 | Status: AC

## 2021-08-17 ENCOUNTER — Other Ambulatory Visit: Payer: Self-pay

## 2021-08-20 ENCOUNTER — Other Ambulatory Visit: Payer: Self-pay

## 2021-08-20 MED ORDER — AMPHETAMINE-DEXTROAMPHET ER 15 MG PO CP24
ORAL_CAPSULE | ORAL | 0 refills | Status: DC
  Filled 2021-10-24: qty 90, 30d supply, fill #0

## 2021-08-20 MED ORDER — AMPHETAMINE-DEXTROAMPHET ER 15 MG PO CP24
ORAL_CAPSULE | ORAL | 0 refills | Status: DC
  Filled 2021-09-24: qty 90, 30d supply, fill #0

## 2021-08-20 MED ORDER — AMPHETAMINE-DEXTROAMPHET ER 15 MG PO CP24
ORAL_CAPSULE | ORAL | 0 refills | Status: DC
  Filled 2021-08-24: qty 90, 30d supply, fill #0

## 2021-08-23 ENCOUNTER — Other Ambulatory Visit: Payer: Self-pay

## 2021-08-24 ENCOUNTER — Other Ambulatory Visit: Payer: Self-pay

## 2021-09-05 ENCOUNTER — Other Ambulatory Visit: Payer: Self-pay

## 2021-09-05 ENCOUNTER — Encounter: Payer: Self-pay | Admitting: Dermatology

## 2021-09-05 ENCOUNTER — Ambulatory Visit: Payer: 59 | Admitting: Dermatology

## 2021-09-05 DIAGNOSIS — L570 Actinic keratosis: Secondary | ICD-10-CM | POA: Diagnosis not present

## 2021-09-05 DIAGNOSIS — N480 Leukoplakia of penis: Secondary | ICD-10-CM

## 2021-09-05 DIAGNOSIS — Z1283 Encounter for screening for malignant neoplasm of skin: Secondary | ICD-10-CM

## 2021-09-05 DIAGNOSIS — L821 Other seborrheic keratosis: Secondary | ICD-10-CM

## 2021-09-05 DIAGNOSIS — R21 Rash and other nonspecific skin eruption: Secondary | ICD-10-CM

## 2021-09-05 NOTE — Progress Notes (Signed)
   Follow-Up Visit   Subjective  Dustin Little is a 60 y.o. male who presents for the following: Annual Exam (Mole check ). Hx of BXO in the groin. Hx of precancers.  The patient presents for Total-Body Skin Exam (TBSE) for skin cancer screening and mole check.   The following portions of the chart were reviewed this encounter and updated as appropriate:   Tobacco  Allergies  Meds  Problems  Med Hx  Surg Hx  Fam Hx     Review of Systems:  No other skin or systemic complaints except as noted in HPI or Assessment and Plan.  Objective  Well appearing patient in no apparent distress; mood and affect are within normal limits.  A full examination was performed including scalp, head, eyes, ears, nose, lips, neck, chest, axillae, abdomen, back, buttocks, bilateral upper extremities, bilateral lower extremities, hands, feet, fingers, toes, fingernails, and toenails. All findings within normal limits unless otherwise noted below.  mid dorsum nose Erythematous thin papules/macules with gritty scale.   genital Clear today per patient    Assessment & Plan  AK (actinic keratosis) mid dorsum nose  Destruction of lesion - mid dorsum nose Complexity: simple   Destruction method: cryotherapy   Informed consent: discussed and consent obtained   Timeout:  patient name, date of birth, surgical site, and procedure verified Lesion destroyed using liquid nitrogen: Yes   Region frozen until ice ball extended beyond lesion: Yes   Outcome: patient tolerated procedure well with no complications   Post-procedure details: wound care instructions given    Rash -history of balanitis xerotica obliterans Genital history of BXO (Balanitis Xerotica Obliterans) Currently in remission. Some pink mild patches vaguely evident in some areas where was in past. Observe.  Seborrheic Keratoses - Stuck-on, waxy, tan-brown papules and/or plaques  - Benign-appearing - Discussed benign etiology and  prognosis. - Observe - Call for any changes  Return in about 1 year (around 09/05/2022) for TBSE, hx of Aks .  IMarye Round, CMA, am acting as scribe for Sarina Ser, MD .  Documentation: I have reviewed the above documentation for accuracy and completeness, and I agree with the above.  Sarina Ser, MD

## 2021-09-05 NOTE — Patient Instructions (Addendum)
Cryotherapy Aftercare  Wash gently with soap and water everyday.   Apply Vaseline and Band-Aid daily until healed.  Prior to procedure, discussed risks of blister formation, small wound, skin dyspigmentation, or rare scar following cryotherapy. Recommend Vaseline ointment to treated areas while healing.    If you have any questions or concerns for your doctor, please call our main line at [phone] and press option 4 to reach your doctor's medical assistant. If no one answers, please leave a voicemail as directed and we will return your call as soon as possible. Messages left after 4 pm will be answered the following business day.   You may also send us a message via MyChart. We typically respond to MyChart messages within 1-2 business days.  For prescription refills, please ask your pharmacy to contact our office. Our fax number is [phone].  If you have an urgent issue when the clinic is closed that cannot wait until the next business day, you can page your doctor at the number below.    Please note that while we do our best to be available for urgent issues outside of office hours, we are not available 24/7.   If you have an urgent issue and are unable to reach us, you may choose to seek medical care at your doctor's office, retail clinic, urgent care center, or emergency room.  If you have a medical emergency, please immediately call 911 or go to the emergency department.  Pager Numbers  - Dr. Kowalski: [phone]  - Dr. Moye: [phone]  - Dr. Stewart: [phone]  In the event of inclement weather, please call our main line at [phone] for an update on the status of any delays or closures.  Dermatology Medication Tips: Please keep the boxes that topical medications come in in order to help keep track of the instructions about where and how to use these. Pharmacies typically print the medication instructions only on the boxes and not directly on the medication  tubes.   If your medication is too expensive, please contact our office at [phone] option 4 or send us a message through MyChart.   We are unable to tell what your co-pay for medications will be in advance as this is different depending on your insurance coverage. However, we may be able to find a substitute medication at lower cost or fill out paperwork to get insurance to cover a needed medication.   If a prior authorization is required to get your medication covered by your insurance company, please allow us 1-2 business days to complete this process.  Drug prices often vary depending on where the prescription is filled and some pharmacies may offer cheaper prices.  The website www.goodrx.com contains coupons for medications through different pharmacies. The prices here do not account for what the cost may be with help from insurance (it may be cheaper with your insurance), but the website can give you the price if you did not use any insurance.  - You can print the associated coupon and take it with your prescription to the pharmacy.  - You may also stop by our office during regular business hours and pick up a GoodRx coupon card.  - If you need your prescription sent electronically to a different pharmacy, notify our office through [place] MyChart or by phone at [phone] option 4.  

## 2021-09-19 DIAGNOSIS — D649 Anemia, unspecified: Secondary | ICD-10-CM | POA: Diagnosis not present

## 2021-09-24 ENCOUNTER — Other Ambulatory Visit: Payer: Self-pay

## 2021-09-24 MED FILL — Bupropion HCl Tab ER 24HR 300 MG: ORAL | 90 days supply | Qty: 90 | Fill #1 | Status: AC

## 2021-09-25 ENCOUNTER — Other Ambulatory Visit: Payer: Self-pay

## 2021-10-03 DIAGNOSIS — F909 Attention-deficit hyperactivity disorder, unspecified type: Secondary | ICD-10-CM | POA: Diagnosis not present

## 2021-10-03 DIAGNOSIS — Z23 Encounter for immunization: Secondary | ICD-10-CM | POA: Diagnosis not present

## 2021-10-03 DIAGNOSIS — F325 Major depressive disorder, single episode, in full remission: Secondary | ICD-10-CM | POA: Diagnosis not present

## 2021-10-03 DIAGNOSIS — D649 Anemia, unspecified: Secondary | ICD-10-CM | POA: Diagnosis not present

## 2021-10-18 ENCOUNTER — Ambulatory Visit: Payer: 59 | Admitting: Dermatology

## 2021-10-18 ENCOUNTER — Other Ambulatory Visit: Payer: Self-pay

## 2021-10-23 ENCOUNTER — Other Ambulatory Visit: Payer: Self-pay

## 2021-10-23 MED ORDER — SILDENAFIL CITRATE 20 MG PO TABS
ORAL_TABLET | ORAL | 11 refills | Status: DC
  Filled 2021-10-23: qty 150, 90d supply, fill #0

## 2021-10-24 ENCOUNTER — Other Ambulatory Visit: Payer: Self-pay

## 2021-11-22 ENCOUNTER — Other Ambulatory Visit: Payer: Self-pay

## 2021-11-22 ENCOUNTER — Other Ambulatory Visit (HOSPITAL_COMMUNITY): Payer: Self-pay

## 2021-11-23 ENCOUNTER — Other Ambulatory Visit: Payer: Self-pay

## 2021-11-26 ENCOUNTER — Other Ambulatory Visit: Payer: Self-pay

## 2021-11-26 MED ORDER — AMPHETAMINE-DEXTROAMPHET ER 15 MG PO CP24
ORAL_CAPSULE | ORAL | 0 refills | Status: DC
  Filled 2021-11-26: qty 60, 20d supply, fill #0

## 2021-11-26 MED ORDER — AMPHETAMINE-DEXTROAMPHET ER 15 MG PO CP24
ORAL_CAPSULE | ORAL | 0 refills | Status: DC
  Filled 2021-12-31: qty 90, 30d supply, fill #0

## 2021-11-26 MED ORDER — AMPHETAMINE-DEXTROAMPHET ER 15 MG PO CP24
ORAL_CAPSULE | ORAL | 0 refills | Status: DC
  Filled 2022-01-30: qty 90, 30d supply, fill #0

## 2021-11-28 ENCOUNTER — Other Ambulatory Visit: Payer: Self-pay

## 2021-11-30 ENCOUNTER — Other Ambulatory Visit: Payer: Self-pay

## 2021-12-03 ENCOUNTER — Other Ambulatory Visit: Payer: Self-pay

## 2021-12-03 ENCOUNTER — Ambulatory Visit: Payer: 59 | Admitting: Dermatology

## 2021-12-03 DIAGNOSIS — L810 Postinflammatory hyperpigmentation: Secondary | ICD-10-CM | POA: Diagnosis not present

## 2021-12-03 DIAGNOSIS — N480 Leukoplakia of penis: Secondary | ICD-10-CM | POA: Diagnosis not present

## 2021-12-03 DIAGNOSIS — D489 Neoplasm of uncertain behavior, unspecified: Secondary | ICD-10-CM

## 2021-12-03 DIAGNOSIS — L578 Other skin changes due to chronic exposure to nonionizing radiation: Secondary | ICD-10-CM

## 2021-12-03 DIAGNOSIS — L821 Other seborrheic keratosis: Secondary | ICD-10-CM | POA: Diagnosis not present

## 2021-12-03 NOTE — Patient Instructions (Signed)

## 2021-12-03 NOTE — Progress Notes (Signed)
° °  Follow-Up Visit   Subjective  Dustin Little is a 61 y.o. male who presents for the following: Skin lesion (On the scalp - has since resolved, but would like area checked. On the back - wife noticed and would like it checked. Pt has also noticed a dark spot on his penis he is concerned about since his sister passed with MM. Pt has a hx of balanitis xerotica obliterans. ).  The following portions of the chart were reviewed this encounter and updated as appropriate:   Tobacco   Allergies   Meds   Problems   Med Hx   Surg Hx   Fam Hx      Review of Systems:  No other skin or systemic complaints except as noted in HPI or Assessment and Plan.  Objective  Well appearing patient in no apparent distress; mood and affect are within normal limits.  A focused examination was performed including scalp, back, penis. Relevant physical exam findings are noted in the Assessment and Plan.  Penis Scattered pink and brown macules on the penis.  Penis Brown macules 0.3 cm with other scattered brown and pink macules.         Assessment & Plan  Balanitis xerotica obliterans Currently in remission but with postinflammatory dyspigmentation Penis See photos Benign-appearing.  Observation.  Call clinic for new or changing lesions.  Recommend daily use of broad spectrum spf 30+ sunscreen to sun-exposed areas.   Discussed biopsy if any changes occur - consistent with PIPA from h/o balanitis xerotica obliterans. Will recheck at f/u appt. Pt to RTC ASAP any changes.   Seborrheic Keratoses - Stuck-on, waxy, tan-brown papules and/or plaques  - Benign-appearing - Discussed benign etiology and prognosis. - Observe - Call for any changes  Actinic Damage - chronic, secondary to cumulative UV radiation exposure/sun exposure over time - diffuse scaly erythematous macules with underlying dyspigmentation - Recommend daily broad spectrum sunscreen SPF 30+ to sun-exposed areas, reapply every 2 hours as  needed.  - Recommend staying in the shade or wearing long sleeves, sun glasses (UVA+UVB protection) and wide brim hats (4-inch brim around the entire circumference of the hat). - Call for new or changing lesions.  Return for appointment as scheduled November 2023.  Luther Redo, CMA, am acting as scribe for Sarina Ser, MD . Documentation: I have reviewed the above documentation for accuracy and completeness, and I agree with the above.  Sarina Ser, MD

## 2021-12-04 ENCOUNTER — Encounter: Payer: Self-pay | Admitting: Dermatology

## 2021-12-28 ENCOUNTER — Other Ambulatory Visit: Payer: Self-pay

## 2021-12-28 MED ORDER — BUPROPION HCL ER (XL) 300 MG PO TB24
300.0000 mg | ORAL_TABLET | Freq: Every day | ORAL | 3 refills | Status: DC
  Filled 2021-12-28: qty 90, 90d supply, fill #0

## 2021-12-31 ENCOUNTER — Other Ambulatory Visit: Payer: Self-pay

## 2022-01-30 ENCOUNTER — Other Ambulatory Visit: Payer: Self-pay

## 2022-02-05 ENCOUNTER — Other Ambulatory Visit: Payer: Self-pay

## 2022-02-11 DIAGNOSIS — F902 Attention-deficit hyperactivity disorder, combined type: Secondary | ICD-10-CM | POA: Diagnosis not present

## 2022-02-23 ENCOUNTER — Telehealth: Payer: 59 | Admitting: Nurse Practitioner

## 2022-02-23 DIAGNOSIS — A499 Bacterial infection, unspecified: Secondary | ICD-10-CM | POA: Diagnosis not present

## 2022-02-23 DIAGNOSIS — N390 Urinary tract infection, site not specified: Secondary | ICD-10-CM

## 2022-02-23 MED ORDER — SULFAMETHOXAZOLE-TRIMETHOPRIM 800-160 MG PO TABS: 1.0000 | ORAL_TABLET | Freq: Two times a day (BID) | ORAL | 0 refills | Status: AC

## 2022-02-23 NOTE — Progress Notes (Signed)
I have spent 5 minutes in review of e-visit questionnaire, review and updating patient chart, medical decision making and response to patient.  ° °Jahmire Ruffins W Augustus Zurawski, NP ° °  °

## 2022-02-23 NOTE — Progress Notes (Signed)
E-Visit for Urinary Problems ? ?We are sorry that you are not feeling well.  Here is how we plan to help! ? ?Based on what you shared with me it looks like you most likely have a simple urinary tract infection. ? ?A UTI (Urinary Tract Infection) is a bacterial infection of the bladder. ? ?Most cases of urinary tract infections are simple to treat but a key part of your care is to encourage you to drink plenty of fluids and watch your symptoms carefully. ? ?I have prescribed Bactrim DS One tablet twice a day for 7 days.  Your symptoms should gradually improve. Call us if the burning in your urine worsens, you develop worsening fever, back pain or pelvic pain or if your symptoms do not resolve after completing the antibiotic. ? ?Urinary tract infections can be prevented by drinking plenty of water to keep your body hydrated.  Also be sure when you wipe, wipe from front to back and don't hold it in!  If possible, empty your bladder every 4 hours. ? ?HOME CARE ?Drink plenty of fluids ?Compete the full course of the antibiotics even if the symptoms resolve ?Remember, when you need to go?go. Holding in your urine can increase the likelihood of getting a UTI! ?GET HELP RIGHT AWAY IF: ?You cannot urinate ?You get a high fever ?Worsening back pain occurs ?You see blood in your urine ?You feel sick to your stomach or throw up ?You feel like you are going to pass out ? ?MAKE SURE YOU  ?Understand these instructions. ?Will watch your condition. ?Will get help right away if you are not doing well or get worse. ? ? ?Thank you for choosing an e-visit. ? ?Your e-visit answers were reviewed by a board certified advanced clinical practitioner to complete your personal care plan. Depending upon the condition, your plan could have included both over the counter or prescription medications. ? ?Please review your pharmacy choice. Make sure the pharmacy is open so you can pick up prescription now. If there is a problem, you may contact  your provider through [employer] and have the prescription routed to another pharmacy.  Your safety is important to [place]. If you have drug allergies check your prescription carefully.  ? ?For the next 24 hours you can use MyChart to ask questions about today's visit, request a non-urgent call back, or ask for a work or school excuse. ?You will get an email in the next two days asking about your experience. I hope that your e-visit has been valuable and will speed your recovery.  ?

## 2022-02-28 ENCOUNTER — Ambulatory Visit
Admission: EM | Admit: 2022-02-28 | Discharge: 2022-02-28 | Disposition: A | Discharge status: Home / Self Care | Payer: 59 | Attending: Internal Medicine | Admitting: Internal Medicine

## 2022-02-28 DIAGNOSIS — N3000 Acute cystitis without hematuria: Secondary | ICD-10-CM | POA: Diagnosis not present

## 2022-02-28 LAB — URINALYSIS, ROUTINE W REFLEX MICROSCOPIC
Bilirubin Urine: NEGATIVE
Glucose, UA: NEGATIVE mg/dL
Ketones, ur: NEGATIVE mg/dL
Nitrite: POSITIVE — AB
Protein, ur: 100 mg/dL — AB
Specific Gravity, Urine: >1.03 — ABNORMAL HIGH (ref 1.005–1.030)
pH: 6 (ref 5.0–8.0)

## 2022-02-28 LAB — URINALYSIS, MICROSCOPIC (REFLEX): WBC, UA: >50 WBC/hpf (ref 0–5)

## 2022-02-28 MED ORDER — CIPROFLOXACIN HCL 500 MG PO TABS: 500.0000 mg | ORAL_TABLET | Freq: Two times a day (BID) | ORAL | 0 refills | Status: DC

## 2022-02-28 NOTE — Discharge Instructions (Signed)
We will call you if we need to change your antibiotic  

## 2022-02-28 NOTE — ED Provider Notes (Signed)
?[facility] ? ? ? ?CSN: [id number] ?Arrival date & time: 02/28/22  1226 ? ? ?  ? ?History   ?Chief Complaint ?Chief Complaint  ?Patient presents with  ? UTI Symptoms  ?  Current Treatment  ? ? ?HPI ?Dustin Little is a 61 y.o. male. with hx of dysuria and frequency and urgency 1 weeks ago. Stream is better as well. Has been on Bactrim for 6 days which was prescribed bic E-visit last week. He denies fever or flank pain. ?Last UTI and only one was one year ago and at which time his medication had to be changed since he was not responding to it. Denies penile discharge.  ? ? ? ?Past Medical History:  ?Diagnosis Date  ? Anxiety   ? BPH (benign prostatic hypertrophy)   ? BXO (balanitis xerotica obliterans)   ? Depression   ? ED (erectile dysfunction)   ? Heartburn   ? History of kidney stones   ? Hypogonadism in male   ? ? ?Patient Active Problem List  ? Diagnosis Date Noted  ? ADHD (attention deficit hyperactivity disorder) 04/19/2018  ? Depression, major, single episode, complete remission ([place]) 05/10/2016  ? Gastroesophageal reflux disease without esophagitis 05/10/2016  ? H/O attention deficit hyperactivity disorder 05/10/2016  ? BPH with obstruction/lower urinary tract symptoms 03/05/2016  ? Erectile dysfunction 03/05/2016  ? Corneal scar, left eye 03/26/2013  ? Keratitis 04/29/2012  ? ? ?Past Surgical History:  ?Procedure Laterality Date  ? CHOLECYSTECTOMY    ? CORNEAL TRANSPLANT  02/04/2013  ? HERNIA REPAIR    ? ? ? ? ? ?Home Medications   ? ?Prior to Admission medications   ?Medication Sig Start Date End Date Taking? Authorizing Provider  ?amphetamine-dextroamphetamine (ADDERALL XR) 15 MG 24 hr capsule Take 3 capsules (45 mg total) by mouth every morning for 30 days  fill 07-24-21 ?Patient taking differently: Take 45 mg by mouth. 3 of '15mg'$  Daily 07/24/21  Yes   ?buPROPion (WELLBUTRIN XL) 300 MG 24 hr tablet Take by mouth.   Yes [provider]  ?ciprofloxacin (CIPRO) 500 MG tablet Take 1 tablet  (500 mg total) by mouth 2 (two) times daily. 02/28/22  Yes Rodriguez-Southworth, Sunday Spillers, PA-C  ?sulfamethoxazole-trimethoprim (BACTRIM DS) 800-160 MG tablet Take 1 tablet by mouth 2 (two) times daily for 7 days. 02/23/22 03/02/22 Yes Gildardo Pounds, NP  ?sildenafil (REVATIO) 20 MG tablet TAKE 3-5 TABLETS TWO HOURS PRIOR TO INTERCOURSE 01/17/20   Zara Council A, PA-C  ?sildenafil (VIAGRA) 100 MG tablet Take by mouth. 11/23/21 11/23/22  [provider]  ? ? ?Family History ?Family History  ?Problem Relation Age of Onset  ? Cancer Unknown   ? Kidney disease Neg Hx   ? Prostate cancer Neg Hx   ? ? ?Social History ?Social History  ? ?Tobacco Use  ? Smoking status: Never  ? Smokeless tobacco: Never  ?Vaping Use  ? Vaping Use: Never used  ?Substance Use Topics  ? Alcohol use: Yes  ?  Alcohol/week: 0.0 standard drinks  ?  Comment: occ  ? Drug use: No  ? ? ? ?Allergies   ?Zolpidem, Orphenadrine citrate, and Amoxicillin-pot clavulanate ? ? ?Review of Systems ?Review of Systems  ?Constitutional:  Negative for fever.  ?Gastrointestinal:  Negative for abdominal pain.  ?Genitourinary:  Positive for dysuria, frequency and urgency. Negative for difficulty urinating, flank pain and penile discharge.  ? ? ?Physical Exam ?Triage Vital Signs ?ED Triage Vitals  ?Enc Vitals Group  ?  BP 02/28/22 1321 130/82  ?   Pulse Rate 02/28/22 1321 80  ?   Resp 02/28/22 1321 18  ?   Temp 02/28/22 1321 98.4 ?F (36.9 ?C)  ?   Temp Source 02/28/22 1321 Oral  ?   SpO2 02/28/22 1321 97 %  ?   Weight 02/28/22 1315 185 lb (83.9 kg)  ?   Height 02/28/22 1315 '6\' 4"'$  (1.93 m)  ?   Head Circumference --   ?   Peak Flow --   ?   Pain Score 02/28/22 1314 6  ?   Pain Loc --   ?   Pain Edu? --   ?   Excl. in [place]? --   ? ?No data found. ? ?Updated Vital Signs ?BP 130/82 (BP Location: Left Arm)   Pulse 80   Temp 98.4 ?F (36.9 ?C) (Oral)   Resp 18   Ht '6\' 4"'$  (1.93 m)   Wt 185 lb (83.9 kg)   SpO2 97%   BMI 22.52 kg/m?  ? ?Visual Acuity ?Right Eye  Distance:   ?Left Eye Distance:   ?Bilateral Distance:   ? ?Right Eye Near:   ?Left Eye Near:    ?Bilateral Near:    ? ? ?Physical Exam ?Vitals and nursing note reviewed.  ?Constitutional:   ?   General: She is not in acute distress. ?   Appearance: She is not toxic-appearing.  ?HENT:  ?   Head: Normocephalic.  ?   Right Ear: External ear normal.  ?   Left Ear: External ear normal.  ?Eyes:  ?   General: No scleral icterus. ?   Conjunctiva/sclera: Conjunctivae normal.  ?Pulmonary:  ?   Effort: Pulmonary effort is normal.  ?Abdominal:  ?   General: Bowel sounds are normal.  ?   Palpations: Abdomen is soft. There is no mass.  ?   Tenderness: There is no guarding or rebound.  ?   Comments: - CVA tenderness   ?Musculoskeletal:     ?   General: Normal range of motion.  ?   Cervical back: Neck supple.  ?   ?Skin: ?   General: Skin is warm and dry.  ?   Findings: No rash.  ?Neurological:  ?   Mental Status: She is alert and oriented to person, place, and time.  ?   Gait: Gait normal.  ?Psychiatric:     ?   Mood and Affect: Mood normal.     ?   Behavior: Behavior normal.     ?   Thought Content: Thought content normal.     ?   Judgment: Judgment normal.  ? ? ?UC Treatments / Results  ?Labs ?(all labs ordered are listed, but only abnormal results are displayed) ?Labs Reviewed  ?URINALYSIS, ROUTINE W REFLEX MICROSCOPIC - Abnormal; Notable for the following components:  ?    Result Value  ? APPearance CLOUDY (*)   ? Specific Gravity, Urine >1.030 (*)   ? Hgb urine dipstick LARGE (*)   ? Protein, ur 100 (*)   ? Nitrite POSITIVE (*)   ? Leukocytes,Ua LARGE (*)   ? All other components within normal limits  ?URINALYSIS, MICROSCOPIC (REFLEX) - Abnormal; Notable for the following components:  ? Bacteria, UA MANY (*)   ? All other components within normal limits  ?URINE CULTURE  ? ? ?EKG ? ? ?Radiology ?No results found. ? ?Procedures ?Procedures (including critical care time) ? ?Medications Ordered in UC ?Medications - No data to  display ? ?Initial Impression / Assessment and Plan / UC Course  ?I have reviewed the triage vital signs and the nursing notes. ? ?Pertinent labs  results that were available during my care of the patient were reviewed by me and considered in my medical decision making (see chart for details). ?UTI not responding to Bactrim ?I ordered a urine culture and we will call him if we need to change his medication ?I placed him on Cipro as noted.  ?  ?Final Clinical Impressions(s) / UC Diagnoses  ? ?Final diagnoses:  ?Acute cystitis without hematuria  ? ? ? ?Discharge Instructions   ? ?  ?We will call you if we need to change your antibiotic ? ? ? ? ?ED Prescriptions   ? ? Medication Sig Dispense Auth. Provider  ? ciprofloxacin (CIPRO) 500 MG tablet Take 1 tablet (500 mg total) by mouth 2 (two) times daily. 14 tablet Rodriguez-Southworth, Sunday Spillers, PA-C  ? ?  ? ?PDMP not reviewed this encounter. ?  ?Shelby Mattocks, PA-C ?02/28/22 1339 ? ?

## 2022-02-28 NOTE — ED Triage Notes (Signed)
Patient is here for "recent UTI" with recent E Visit on Saturday. On Bactrim, "but frequency Urination and burning remains".  No discharge. No fever.  ?

## 2022-03-03 LAB — URINE CULTURE: Culture: >=100000 — AB

## 2022-03-08 ENCOUNTER — Other Ambulatory Visit: Payer: Self-pay

## 2022-03-08 MED ORDER — AMPHETAMINE-DEXTROAMPHET ER 15 MG PO CP24
ORAL_CAPSULE | ORAL | 0 refills | Status: AC
Start: 2022-03-08 — End: ?
  Filled 2022-03-08: qty 30, 30d supply, fill #0
  Filled 2022-03-18 – 2022-07-30 (×2): qty 30, 30d supply, fill #1

## 2022-03-12 ENCOUNTER — Other Ambulatory Visit: Payer: Self-pay

## 2022-03-14 ENCOUNTER — Other Ambulatory Visit: Payer: Self-pay

## 2022-03-15 ENCOUNTER — Other Ambulatory Visit: Payer: Self-pay

## 2022-03-18 ENCOUNTER — Other Ambulatory Visit: Payer: Self-pay

## 2022-03-19 ENCOUNTER — Other Ambulatory Visit: Payer: Self-pay

## 2022-03-19 MED ORDER — AMPHETAMINE-DEXTROAMPHET ER 15 MG PO CP24
ORAL_CAPSULE | ORAL | 0 refills | Status: DC
  Filled 2022-03-19 (×2): qty 90, 30d supply, fill #0

## 2022-03-20 ENCOUNTER — Other Ambulatory Visit: Payer: Self-pay

## 2022-03-25 DIAGNOSIS — F902 Attention-deficit hyperactivity disorder, combined type: Secondary | ICD-10-CM | POA: Diagnosis not present

## 2022-04-04 ENCOUNTER — Other Ambulatory Visit: Payer: Self-pay

## 2022-04-04 MED ORDER — BUPROPION HCL ER (XL) 300 MG PO TB24
300.0000 mg | ORAL_TABLET | Freq: Every day | ORAL | 3 refills | Status: AC
  Filled 2022-04-04: qty 90, 90d supply, fill #0
  Filled 2022-07-12: qty 90, 90d supply, fill #1
  Filled 2022-10-25: qty 90, 90d supply, fill #2
  Filled 2023-01-29: qty 90, 90d supply, fill #3

## 2022-04-05 ENCOUNTER — Other Ambulatory Visit: Payer: Self-pay

## 2022-04-08 DIAGNOSIS — F902 Attention-deficit hyperactivity disorder, combined type: Secondary | ICD-10-CM | POA: Diagnosis not present

## 2022-04-09 DIAGNOSIS — Z125 Encounter for screening for malignant neoplasm of prostate: Secondary | ICD-10-CM | POA: Diagnosis not present

## 2022-04-09 DIAGNOSIS — R7303 Prediabetes: Secondary | ICD-10-CM | POA: Diagnosis not present

## 2022-04-09 DIAGNOSIS — Z8659 Personal history of other mental and behavioral disorders: Secondary | ICD-10-CM | POA: Diagnosis not present

## 2022-04-09 DIAGNOSIS — F325 Major depressive disorder, single episode, in full remission: Secondary | ICD-10-CM | POA: Diagnosis not present

## 2022-04-09 DIAGNOSIS — N138 Other obstructive and reflux uropathy: Secondary | ICD-10-CM | POA: Diagnosis not present

## 2022-04-09 DIAGNOSIS — N401 Enlarged prostate with lower urinary tract symptoms: Secondary | ICD-10-CM | POA: Diagnosis not present

## 2022-04-12 ENCOUNTER — Other Ambulatory Visit: Payer: Self-pay

## 2022-04-12 MED ORDER — AMPHETAMINE-DEXTROAMPHET ER 15 MG PO CP24
ORAL_CAPSULE | ORAL | 0 refills | Status: DC
  Filled 2022-04-18: qty 90, 30d supply, fill #0

## 2022-04-18 ENCOUNTER — Other Ambulatory Visit: Payer: Self-pay

## 2022-04-24 ENCOUNTER — Encounter: Payer: Self-pay | Admitting: Emergency Medicine

## 2022-04-24 ENCOUNTER — Emergency Department: Payer: 59

## 2022-04-24 ENCOUNTER — Ambulatory Visit (INDEPENDENT_AMBULATORY_CARE_PROVIDER_SITE_OTHER): Payer: 59

## 2022-04-24 ENCOUNTER — Emergency Department
Admission: EM | Admit: 2022-04-24 | Discharge: 2022-04-24 | Disposition: A | Discharge status: Other Acute Inpatient Hospital | Payer: 59 | Source: Home / Self Care | Attending: Emergency Medicine | Admitting: Emergency Medicine

## 2022-04-24 ENCOUNTER — Inpatient Hospital Stay
Admission: AD | Admit: 2022-04-24 | Discharge: 2022-05-02 | DRG: 200 | Disposition: A | Discharge status: Home / Self Care | Payer: 59 | Source: Other Acute Inpatient Hospital | Attending: Surgery | Admitting: Surgery

## 2022-04-24 ENCOUNTER — Other Ambulatory Visit: Payer: Self-pay | Admitting: General Surgery

## 2022-04-24 ENCOUNTER — Ambulatory Visit
Admission: EM | Admit: 2022-04-24 | Discharge: 2022-04-24 | Disposition: A | Discharge status: Home / Self Care | Payer: 59 | Source: Home / Self Care

## 2022-04-24 ENCOUNTER — Other Ambulatory Visit: Payer: Self-pay

## 2022-04-24 DIAGNOSIS — R0600 Dyspnea, unspecified: Secondary | ICD-10-CM | POA: Insufficient documentation

## 2022-04-24 DIAGNOSIS — Y92009 Unspecified place in unspecified non-institutional (private) residence as the place of occurrence of the external cause: Secondary | ICD-10-CM | POA: Insufficient documentation

## 2022-04-24 DIAGNOSIS — S2242XA Multiple fractures of ribs, left side, initial encounter for closed fracture: Secondary | ICD-10-CM | POA: Insufficient documentation

## 2022-04-24 DIAGNOSIS — T797XXA Traumatic subcutaneous emphysema, initial encounter: Secondary | ICD-10-CM | POA: Diagnosis not present

## 2022-04-24 DIAGNOSIS — J9859 Other diseases of mediastinum, not elsewhere classified: Secondary | ICD-10-CM | POA: Diagnosis not present

## 2022-04-24 DIAGNOSIS — N400 Enlarged prostate without lower urinary tract symptoms: Secondary | ICD-10-CM | POA: Diagnosis present

## 2022-04-24 DIAGNOSIS — S20309A Unspecified superficial injuries of unspecified front wall of thorax, initial encounter: Secondary | ICD-10-CM | POA: Insufficient documentation

## 2022-04-24 DIAGNOSIS — J9811 Atelectasis: Secondary | ICD-10-CM | POA: Diagnosis not present

## 2022-04-24 DIAGNOSIS — S0990XA Unspecified injury of head, initial encounter: Secondary | ICD-10-CM | POA: Insufficient documentation

## 2022-04-24 DIAGNOSIS — J439 Emphysema, unspecified: Secondary | ICD-10-CM | POA: Diagnosis not present

## 2022-04-24 DIAGNOSIS — J939 Pneumothorax, unspecified: Secondary | ICD-10-CM | POA: Diagnosis not present

## 2022-04-24 DIAGNOSIS — S270XXA Traumatic pneumothorax, initial encounter: Secondary | ICD-10-CM

## 2022-04-24 DIAGNOSIS — S301XXA Contusion of abdominal wall, initial encounter: Secondary | ICD-10-CM | POA: Diagnosis not present

## 2022-04-24 DIAGNOSIS — Y990 Civilian activity done for income or pay: Secondary | ICD-10-CM | POA: Diagnosis not present

## 2022-04-24 DIAGNOSIS — W19XXXA Unspecified fall, initial encounter: Secondary | ICD-10-CM | POA: Insufficient documentation

## 2022-04-24 DIAGNOSIS — R160 Hepatomegaly, not elsewhere classified: Secondary | ICD-10-CM | POA: Diagnosis not present

## 2022-04-24 DIAGNOSIS — K219 Gastro-esophageal reflux disease without esophagitis: Secondary | ICD-10-CM | POA: Diagnosis not present

## 2022-04-24 DIAGNOSIS — J9690 Respiratory failure, unspecified, unspecified whether with hypoxia or hypercapnia: Secondary | ICD-10-CM | POA: Diagnosis not present

## 2022-04-24 DIAGNOSIS — W010XXA Fall on same level from slipping, tripping and stumbling without subsequent striking against object, initial encounter: Secondary | ICD-10-CM | POA: Diagnosis present

## 2022-04-24 DIAGNOSIS — R0789 Other chest pain: Secondary | ICD-10-CM | POA: Diagnosis not present

## 2022-04-24 DIAGNOSIS — Y92008 Other place in unspecified non-institutional (private) residence as the place of occurrence of the external cause: Secondary | ICD-10-CM | POA: Diagnosis not present

## 2022-04-24 DIAGNOSIS — K449 Diaphragmatic hernia without obstruction or gangrene: Secondary | ICD-10-CM | POA: Diagnosis not present

## 2022-04-24 DIAGNOSIS — Z79899 Other long term (current) drug therapy: Secondary | ICD-10-CM | POA: Diagnosis not present

## 2022-04-24 DIAGNOSIS — R188 Other ascites: Secondary | ICD-10-CM | POA: Diagnosis not present

## 2022-04-24 DIAGNOSIS — Z4682 Encounter for fitting and adjustment of non-vascular catheter: Secondary | ICD-10-CM | POA: Diagnosis not present

## 2022-04-24 LAB — CBC WITH DIFFERENTIAL/PLATELET
Abs Immature Granulocytes: 0.03 10*3/uL (ref 0.00–0.07)
Basophils Absolute: 0.1 10*3/uL (ref 0.0–0.1)
Basophils Relative: 1 %
Eosinophils Absolute: 0.4 10*3/uL (ref 0.0–0.5)
Eosinophils Relative: 5 %
HCT: 47.5 % (ref 39.0–52.0)
Hemoglobin: 15.4 g/dL (ref 13.0–17.0)
Immature Granulocytes: 0 %
Lymphocytes Relative: 30 %
Lymphs Abs: 2.8 10*3/uL (ref 0.7–4.0)
MCH: 30.9 pg (ref 26.0–34.0)
MCHC: 32.4 g/dL (ref 30.0–36.0)
MCV: 95.2 fL (ref 80.0–100.0)
Monocytes Absolute: 0.8 10*3/uL (ref 0.1–1.0)
Monocytes Relative: 9 %
Neutro Abs: 5 10*3/uL (ref 1.7–7.7)
Neutrophils Relative %: 55 %
Platelets: 227 10*3/uL (ref 150–400)
RBC: 4.99 MIL/uL (ref 4.22–5.81)
RDW: 11.8 % (ref 11.5–15.5)
WBC: 9.1 10*3/uL (ref 4.0–10.5)
nRBC: 0 % (ref 0.0–0.2)

## 2022-04-24 LAB — TYPE AND SCREEN
ABO/RH(D): O POS
Antibody Screen: NEGATIVE

## 2022-04-24 LAB — COMPREHENSIVE METABOLIC PANEL
ALT: 18 U/L (ref 0–44)
AST: 20 U/L (ref 15–41)
Albumin: 4.7 g/dL (ref 3.5–5.0)
Alkaline Phosphatase: 70 U/L (ref 38–126)
Anion gap: 9 (ref 5–15)
BUN: 18 mg/dL (ref 6–20)
CO2: 25 mmol/L (ref 22–32)
Calcium: 9.4 mg/dL (ref 8.9–10.3)
Chloride: 105 mmol/L (ref 98–111)
Creatinine, Ser: 0.8 mg/dL (ref 0.61–1.24)
GFR, Estimated: >60 mL/min (ref >60)
Glucose, Bld: 99 mg/dL (ref 70–99)
Potassium: 3.4 mmol/L — ABNORMAL LOW (ref 3.5–5.1)
Sodium: 139 mmol/L (ref 135–145)
Total Bilirubin: 1 mg/dL (ref 0.3–1.2)
Total Protein: 7.7 g/dL (ref 6.5–8.1)

## 2022-04-24 LAB — PROTIME-INR
INR: 1 (ref 0.8–1.2)
Prothrombin Time: 13.3 seconds (ref 11.4–15.2)

## 2022-04-24 LAB — APTT: aPTT: 27 seconds (ref 24–36)

## 2022-04-24 MED ORDER — ONDANSETRON 4 MG PO TBDP: 4.0000 mg | ORAL_TABLET | Freq: Four times a day (QID) | ORAL | Status: AC | PRN

## 2022-04-24 MED ORDER — METHOCARBAMOL 500 MG PO TABS: 500.0000 mg | ORAL_TABLET | Freq: Three times a day (TID) | ORAL | Status: DC | PRN

## 2022-04-24 MED ORDER — LIDOCAINE-EPINEPHRINE 1 %-1:100000 IJ SOLN
20.0000 mL | Freq: Once | INTRAMUSCULAR | Status: AC
  Administered 2022-04-24: 20 mL
  Filled 2022-04-24: qty 20

## 2022-04-24 MED ORDER — MELATONIN 1 MG PO TABS: 3.0000 mg | ORAL_TABLET | Freq: Every evening | ORAL | Status: AC | PRN

## 2022-04-24 MED ORDER — MORPHINE SULFATE (PF) 4 MG/ML IV SOLN: 1.0000 mg | INTRAVENOUS | Status: AC | PRN

## 2022-04-24 MED ORDER — METHOCARBAMOL 500 MG PO TABS: 500.0000 mg | ORAL_TABLET | Freq: Three times a day (TID) | ORAL | Status: AC | PRN

## 2022-04-24 MED ORDER — METHOCARBAMOL 1000 MG/10ML IJ SOLN: 500.0000 mg | Freq: Three times a day (TID) | INTRAVENOUS | Status: AC | PRN

## 2022-04-24 MED ORDER — ACETAMINOPHEN 500 MG PO TABS: 1000.0000 mg | ORAL_TABLET | Freq: Four times a day (QID) | ORAL | Status: DC | PRN

## 2022-04-24 MED ORDER — FENTANYL CITRATE PF 50 MCG/ML IJ SOSY
50.0000 ug | PREFILLED_SYRINGE | Freq: Once | INTRAMUSCULAR | Status: AC
  Administered 2022-04-24: 50 ug via INTRAVENOUS
  Filled 2022-04-24: qty 1

## 2022-04-24 MED ORDER — HYDROMORPHONE HCL 1 MG/ML IJ SOLN
0.5000 mg | Freq: Once | INTRAMUSCULAR | Status: AC
  Administered 2022-04-24: 0.5 mg via INTRAVENOUS
  Filled 2022-04-24: qty 0.5

## 2022-04-24 MED ORDER — SODIUM CHLORIDE 0.9% FLUSH: 3.0000 mL | INTRAVENOUS | Status: AC | PRN

## 2022-04-24 MED ORDER — ONDANSETRON 4 MG PO TBDP: 4.0000 mg | ORAL_TABLET | Freq: Four times a day (QID) | ORAL | Status: DC | PRN

## 2022-04-24 MED ORDER — PROCHLORPERAZINE EDISYLATE 10 MG/2ML IJ SOLN: 5.0000 mg | Freq: Four times a day (QID) | INTRAMUSCULAR | Status: DC | PRN

## 2022-04-24 MED ORDER — MORPHINE SULFATE (PF) 2 MG/ML IV SOLN
1.0000 mg | INTRAVENOUS | Status: DC | PRN
  Administered 2022-04-25: 2 mg via INTRAVENOUS
  Filled 2022-04-24: qty 1

## 2022-04-24 MED ORDER — ENOXAPARIN SODIUM 150 MG/ML IJ SOSY: 30.0000 mg | PREFILLED_SYRINGE | Freq: Two times a day (BID) | INTRAMUSCULAR | Status: AC

## 2022-04-24 MED ORDER — MELATONIN 3 MG PO TABS
3.0000 mg | ORAL_TABLET | Freq: Every evening | ORAL | Status: DC | PRN
  Administered 2022-04-26 – 2022-05-02 (×5): 3 mg via ORAL
  Filled 2022-04-24 (×6): qty 1

## 2022-04-24 MED ORDER — ONDANSETRON HCL 4 MG/2ML IJ SOLN
4.0000 mg | Freq: Four times a day (QID) | INTRAMUSCULAR | Status: DC | PRN
  Administered 2022-04-27 – 2022-04-30 (×4): 4 mg via INTRAVENOUS
  Filled 2022-04-24 (×4): qty 2

## 2022-04-24 MED ORDER — SODIUM CHLORIDE 0.9% FLUSH: 3.0000 mL | Freq: Two times a day (BID) | INTRAVENOUS | Status: AC

## 2022-04-24 MED ORDER — OXYCODONE HCL 5 MG PO TABS
5.0000 mg | ORAL_TABLET | ORAL | Status: DC | PRN
  Administered 2022-04-25 – 2022-04-29 (×17): 10 mg via ORAL
  Administered 2022-04-30 (×2): 5 mg via ORAL
  Administered 2022-04-30: 10 mg via ORAL
  Administered 2022-05-01 – 2022-05-02 (×3): 5 mg via ORAL
  Filled 2022-04-24 (×5): qty 2
  Filled 2022-04-24: qty 1
  Filled 2022-04-24 (×14): qty 2
  Filled 2022-04-24: qty 1
  Filled 2022-04-24 (×2): qty 2

## 2022-04-24 MED ORDER — SODIUM CHLORIDE 0.9% FLUSH
3.0000 mL | Freq: Two times a day (BID) | INTRAVENOUS | Status: DC
  Administered 2022-04-24 – 2022-05-02 (×16): 3 mL via INTRAVENOUS

## 2022-04-24 MED ORDER — IOHEXOL 300 MG/ML  SOLN
100.0000 mL | Freq: Once | INTRAMUSCULAR | Status: AC | PRN
  Administered 2022-04-24: 100 mL via INTRAVENOUS

## 2022-04-24 MED ORDER — PROCHLORPERAZINE MALEATE 10 MG PO TABS: 10.0000 mg | ORAL_TABLET | Freq: Four times a day (QID) | ORAL | Status: DC | PRN

## 2022-04-24 MED ORDER — DOCUSATE SODIUM 50 MG PO CAPS: 100.0000 mg | ORAL_CAPSULE | Freq: Two times a day (BID) | ORAL | Status: AC

## 2022-04-24 MED ORDER — PROCHLORPERAZINE MALEATE 5 MG PO TABS: 10.0000 mg | ORAL_TABLET | Freq: Four times a day (QID) | ORAL | Status: AC | PRN

## 2022-04-24 MED ORDER — ACETAMINOPHEN 325 MG PO TABS: 650.0000 mg | ORAL_TABLET | ORAL | Status: AC | PRN

## 2022-04-24 MED ORDER — OXYCODONE HCL 5 MG PO TABS: 5.0000 mg | ORAL_TABLET | ORAL | Status: AC | PRN

## 2022-04-24 MED ORDER — METHOCARBAMOL 1000 MG/10ML IJ SOLN: 500.0000 mg | Freq: Three times a day (TID) | INTRAMUSCULAR | Status: DC | PRN

## 2022-04-24 MED ORDER — SODIUM CHLORIDE 0.9 % IV SOLN: 250.0000 mL | INTRAVENOUS | Status: AC | PRN

## 2022-04-24 MED ORDER — SODIUM CHLORIDE 0.9% FLUSH: 3.0000 mL | INTRAVENOUS | Status: DC | PRN

## 2022-04-24 MED ORDER — SODIUM CHLORIDE 0.9 % IV SOLN: 4.0000 mg | Freq: Four times a day (QID) | INTRAVENOUS | Status: AC | PRN

## 2022-04-24 MED ORDER — PROCHLORPERAZINE EDISYLATE 10 MG/2ML IJ SOLN: 5.0000 mg | Freq: Four times a day (QID) | INTRAMUSCULAR | Status: AC | PRN

## 2022-04-24 MED ORDER — DOCUSATE SODIUM 100 MG PO CAPS
100.0000 mg | ORAL_CAPSULE | Freq: Two times a day (BID) | ORAL | Status: DC
  Administered 2022-04-25 – 2022-05-02 (×13): 100 mg via ORAL
  Filled 2022-04-24 (×14): qty 1

## 2022-04-24 MED ORDER — ENOXAPARIN SODIUM 30 MG/0.3ML IJ SOSY
30.0000 mg | PREFILLED_SYRINGE | Freq: Two times a day (BID) | INTRAMUSCULAR | Status: DC
  Administered 2022-04-25 – 2022-05-02 (×14): 30 mg via SUBCUTANEOUS
  Filled 2022-04-24 (×16): qty 0.3

## 2022-04-24 MED ORDER — SODIUM CHLORIDE 0.9 % IV SOLN: 250.0000 mL | INTRAVENOUS | Status: DC | PRN

## 2022-04-24 NOTE — ED Notes (Signed)
Xray called at this time.

## 2022-04-24 NOTE — ED Provider Notes (Addendum)
[facility]    CSN: [id number] Arrival date & time: 04/24/22  1429      History   Chief Complaint Chief Complaint  Patient presents with   Fall   Rib Cage Pain    HPI Dustin Little is a 61 y.o. male.   HPI  61 year old male here for evaluation of left rib pain.  Patient reports that he was working on a roof yesterday when his feet slipped and his safety harness pulled him back causing him to land on the retractor.  He states that since that time he feels like he cannot take a deep breath and he is also been mildly short of breath.  He states the pain also increases with movement.  He denies any cough or hemoptysis.  Past Medical History:  Diagnosis Date   Anxiety    BPH (benign prostatic hypertrophy)    BXO (balanitis xerotica obliterans)    Depression    ED (erectile dysfunction)    Heartburn    History of kidney stones    Hypogonadism in male     Patient Active Problem List   Diagnosis Date Noted   ADHD (attention deficit hyperactivity disorder) 04/19/2018   Depression, major, single episode, complete remission ([place]) 05/10/2016   Gastroesophageal reflux disease without esophagitis 05/10/2016   H/O attention deficit hyperactivity disorder 05/10/2016   BPH with obstruction/lower urinary tract symptoms 03/05/2016   Erectile dysfunction 03/05/2016   Corneal scar, left eye 03/26/2013   Keratitis 04/29/2012    Past Surgical History:  Procedure Laterality Date   CHOLECYSTECTOMY     CORNEAL TRANSPLANT  02/04/2013   HERNIA REPAIR         Home Medications    Prior to Admission medications   Medication Sig Start Date End Date Taking? Authorizing Provider  amphetamine-dextroamphetamine (ADDERALL XR) 15 MG 24 hr capsule Take 3 capsules (45 mg total) by mouth every morning for 30 days  fill 07-24-21 Patient taking differently: Take 45 mg by mouth. 3 of '15mg'$  Daily 07/24/21     amphetamine-dextroamphetamine (ADDERALL XR) 15 MG 24 hr capsule Take 3  capsules (45 mg total) by mouth every morning for 30 days . 03/08/22     amphetamine-dextroamphetamine (ADDERALL XR) 15 MG 24 hr capsule Take 3 capsules (45 mg total) by mouth every morning for 30 days . 03/19/22     amphetamine-dextroamphetamine (ADDERALL XR) 15 MG 24 hr capsule Take 1 capsule (15 mg total) by mouth 3 (three) times daily for 30 days . 04/18/22     buPROPion (WELLBUTRIN XL) 300 MG 24 hr tablet Take by mouth.    [provider]  buPROPion (WELLBUTRIN XL) 300 MG 24 hr tablet TAKE 1 TABLET BY MOUTH ONCE DAILY 04/04/22     ciprofloxacin (CIPRO) 500 MG tablet Take 1 tablet (500 mg total) by mouth 2 (two) times daily. 02/28/22   Rodriguez-Southworth, Sunday Spillers, PA-C  sildenafil (REVATIO) 20 MG tablet TAKE 3-5 TABLETS TWO HOURS PRIOR TO INTERCOURSE 01/17/20   Zara Council A, PA-C  sildenafil (VIAGRA) 100 MG tablet Take by mouth. 11/23/21 11/23/22  [provider]    Family History Family History  Problem Relation Age of Onset   Cancer Unknown    Kidney disease Neg Hx    Prostate cancer Neg Hx     Social History Social History   Tobacco Use   Smoking status: Never   Smokeless tobacco: Never  Vaping Use   Vaping Use: Never used  Substance Use Topics  Alcohol use: Yes    Alcohol/week: 0.0 standard drinks of alcohol    Comment: occ   Drug use: No     Allergies   Zolpidem, Orphenadrine citrate, and Amoxicillin-pot clavulanate   Review of Systems Review of Systems  Respiratory:  Positive for shortness of breath. Negative for cough and wheezing.   Cardiovascular:  Positive for chest pain.  Skin:  Positive for color change.  Hematological: Negative.   Psychiatric/Behavioral: Negative.       Physical Exam Triage Vital Signs ED Triage Vitals  Enc Vitals Group     BP 04/24/22 1524 120/72     Pulse Rate 04/24/22 1524 62     Resp 04/24/22 1524 18     Temp 04/24/22 1524 97.8 F (36.6 C)     Temp Source 04/24/22 1524 Oral     SpO2 04/24/22 1524 95 %      Weight --      Height --      Head Circumference --      Peak Flow --      Pain Score 04/24/22 1522 9     Pain Loc --      Pain Edu? --      Excl. in [place]? --    No data found.  Updated Vital Signs BP 120/72 (BP Location: Left Arm)   Pulse 62   Temp 97.8 F (36.6 C) (Oral)   Resp 18   SpO2 95%   Visual Acuity Right Eye Distance:   Left Eye Distance:   Bilateral Distance:    Right Eye Near:   Left Eye Near:    Bilateral Near:     Physical Exam Vitals and nursing note reviewed.  Constitutional:      Appearance: Normal appearance.  HENT:     Head: Normocephalic and atraumatic.  Cardiovascular:     Rate and Rhythm: Normal rate and regular rhythm.     Pulses: Normal pulses.     Heart sounds: Normal heart sounds. No murmur heard.    No friction rub. No gallop.  Pulmonary:     Effort: Pulmonary effort is normal.     Breath sounds: Normal breath sounds. No wheezing, rhonchi or rales.  Chest:     Chest wall: Tenderness present.  Skin:    General: Skin is warm and dry.     Capillary Refill: Capillary refill takes less than 2 seconds.     Findings: Bruising present.  Neurological:     General: No focal deficit present.     Mental Status: He is alert and oriented to person, place, and time.  Psychiatric:        Mood and Affect: Mood normal.        Behavior: Behavior normal.        Thought Content: Thought content normal.        Judgment: Judgment normal.      UC Treatments / Results  Labs (all labs ordered are listed, but only abnormal results are displayed) Labs Reviewed - No data to display  EKG   Radiology No results found.  Procedures Procedures (including critical care time)  Medications Ordered in UC Medications - No data to display  Initial Impression / Assessment and Plan / UC Course  I have reviewed the triage vital signs and the nursing notes.  Pertinent labs & imaging results that were available during my care of the patient were reviewed  by me and considered in my medical decision making (see chart for  details).  Patient is a pleasant, nontoxic-appearing 61 year old male who does appear to be in a moderate degree of pain here for evaluation of left rib pain and shortness of breath that happened after sustaining a fall yesterday while on a roof.  He did not fall off the roof but rather he fell back on his safety harness housing and impacted his left ribs posteriorly.  Patient is moving gingerly but he is moving all extremities.  Examination of the chest wall reveals some ecchymosis the posterior, superior, lateral chest wall near the axilla on the left.  This area is tender to touch and the tenderness extends inferiorly along the ribs and chest wall.  No crepitus appreciated.  Lung sounds are decreased on the left side.  Will obtain radiograph of left ribs to look for the presence of rib fracture.  I was contacted by the x-ray tech following the chest x-ray as patient has a pneumothorax.  On exam patient's left lung is nearly completely collapsed.  I have advised the patient that his lung is collapsed and he will need to go to the emergency department.  He and his wife are going to travel via POV to the emergency department at [facility].  I have offered EMS but they would prefer to go via POV.  Patient is able to speak in full sentences and he is in no respiratory distress so I do believe it is safe for him to travel POV.  I spoke with the radiologist who indicates that the patient also has nondisplaced rib fractures of the seventh and eighth rib on the left side.   Final Clinical Impressions(s) / UC Diagnoses   Final diagnoses:  Traumatic pneumothorax, initial encounter  Closed fracture of multiple ribs of left side, initial encounter     Discharge Instructions      Please go to the emergency department at [facility] for treatment of your collapsed lung.  Please go now.     ED Prescriptions   None    I have reviewed the PDMP during  this encounter.   Margarette Canada, NP 04/24/22 1550    Margarette Canada, NP 04/24/22 1554

## 2022-04-24 NOTE — ED Notes (Signed)
Patient state his back hurts the most at this time

## 2022-04-24 NOTE — ED Notes (Signed)
2nd type and screen sent down on pt at this time

## 2022-04-24 NOTE — ED Notes (Signed)
Pt given urinal at this time 

## 2022-04-24 NOTE — ED Notes (Signed)
At bedside with 2 MD's doing pigatil chestube insertion

## 2022-04-24 NOTE — ED Notes (Signed)
Per MD low continuous suction going at this time

## 2022-04-24 NOTE — ED Notes (Signed)
[place] Reviewed by this RN

## 2022-04-24 NOTE — ED Notes (Signed)
Patient is being discharged from the Urgent Care and sent to the Emergency Department via POV . Per Ysidro Evert NP, patient is in need of higher level of care due to pneumothorax and multiple rib fractures. Patient is aware and verbalizes understanding of plan of care.  Vitals:   04/24/22 1524  BP: 120/72  Pulse: 62  Resp: 18  Temp: 97.8 F (36.6 C)  SpO2: 95%

## 2022-04-24 NOTE — ED Notes (Signed)
MD numbing the patients left chest-medial ribcage at this time

## 2022-04-24 NOTE — ED Notes (Signed)
[facility] arrived at 2219

## 2022-04-24 NOTE — Progress Notes (Signed)
Transition of Care [facility]) - CAGE-AID Screening   Patient Details  Name: Dustin Little MRN: [id number] Date of Birth: 27-Nov-1960  [facility] CM/SW Contact:    Army Melia, RN Phone Number: 04/24/2022, 11:47 PM   Clinical Narrative:  Reports daily marijuana use and declines resources, states "it's an anxiety thing."  CAGE-AID Screening:    Have You Ever Felt You Ought to Cut Down on Your Drinking or Drug Use?: No Have People Annoyed You By Critizing Your Drinking Or Drug Use?: No Have You Felt Bad Or Guilty About Your Drinking Or Drug Use?: No Have You Ever Had a Drink or Used Drugs First Thing In The Morning to Steady Your Nerves or to Get Rid of a Hangover?: No CAGE-AID Score: 0  Substance Abuse Education Offered: No (declined resources)

## 2022-04-24 NOTE — ED Notes (Signed)
[facility] pending bed [facility] 1700

## 2022-04-24 NOTE — Discharge Instructions (Addendum)
Please go to the emergency department at [facility] for treatment of your collapsed lung.  Please go now.

## 2022-04-24 NOTE — ED Provider Notes (Addendum)
[facility] Provider Note    Event Date/Time   First MD Initiated Contact with Patient 04/24/22 1614     (approximate)   History   Fall and Chest Injury   HPI  Dustin Little is a 61 y.o. male  with pmh depression, anxiety who presents with chest pain after a fall. Yesterday patient was working on a house when he had a fall. He did not fall to the ground he fell about the level of his height. Fell onto his left chest.  Did not hit his head.  He endorses pain in the left chest wall that is worse with inspiration.  Endorses dyspnea.  Had 2 sleep sitting up today.  He worked the rest of the day after the injury.  Denies other pain.     Past Medical History:  Diagnosis Date   Anxiety    BPH (benign prostatic hypertrophy)    BXO (balanitis xerotica obliterans)    Depression    ED (erectile dysfunction)    Heartburn    History of kidney stones    Hypogonadism in male     Patient Active Problem List   Diagnosis Date Noted   ADHD (attention deficit hyperactivity disorder) 04/19/2018   Depression, major, single episode, complete remission ([place]) 05/10/2016   Gastroesophageal reflux disease without esophagitis 05/10/2016   H/O attention deficit hyperactivity disorder 05/10/2016   BPH with obstruction/lower urinary tract symptoms 03/05/2016   Erectile dysfunction 03/05/2016   Corneal scar, left eye 03/26/2013   Keratitis 04/29/2012     Physical Exam  Triage Vital Signs: ED Triage Vitals  Enc Vitals Group     BP 04/24/22 1622 (!) 156/83     Pulse Rate 04/24/22 1619 80     Resp 04/24/22 1619 20     Temp 04/24/22 1622 97.9 F (36.6 C)     Temp Source 04/24/22 1622 Oral     SpO2 04/24/22 1619 96 %     Weight 04/24/22 1620 184 lb 15.5 oz (83.9 kg)     Height 04/24/22 1620 '6\' 4"'$  (1.93 m)     Head Circumference --      Peak Flow --      Pain Score 04/24/22 1622 2     Pain Loc --      Pain Edu? --      Excl. in [place]? --     Most recent vital  signs: Vitals:   04/24/22 1705 04/24/22 1730  BP: (!) 154/88 137/76  Pulse: 73 81  Resp: (!) 21 20  Temp:    SpO2: 99% 97%     General: Awake, no distress.  CV:  Good peripheral perfusion.  Resp:  Normal effort.  Decreased breath sounds on the left, left lateral chest wall tenderness no crepitus no ecchymosis Abd:  No distention.  Abdomen is soft nontender throughout Neuro:             Awake, Alert, Oriented x 3  Other:  No C, T or L-spine tenderness Pelvis is stable nontender No focal tenderness or deformity of the bilateral upper or lower extremities No trauma to the head or neck   ED Results / Procedures / Treatments  Labs (all labs ordered are listed, but only abnormal results are displayed) Labs Reviewed  COMPREHENSIVE METABOLIC PANEL - Abnormal; Notable for the following components:      Result Value   Potassium 3.4 (*)    All other components within normal limits  CBC WITH  DIFFERENTIAL/PLATELET  PROTIME-INR  APTT  TYPE AND SCREEN     EKG  EKG interpretation performed by myself: NSR, nml axis, nml intervals, no acute ischemic changes    RADIOLOGY I reviewed the x-ray performed at urgent care which shows a large left pneumothorax   PROCEDURES:  Critical Care performed: Yes, see critical care procedure note(s)  .1-3 Lead EKG Interpretation  Performed by: Rada Hay, MD Authorized by: Rada Hay, MD     Interpretation: normal     Rhythm: sinus rhythm     Ectopy: none     Conduction: normal   CHEST TUBE INSERTION  Date/Time: 04/24/2022 5:45 PM  Performed by: Rada Hay, MD Authorized by: Rada Hay, MD   Consent:    Consent obtained:  Verbal   Consent given by:  Patient   Risks discussed:  Bleeding, incomplete drainage, infection, damage to surrounding structures and pain   Alternatives discussed:  No treatment Pre-procedure details:    Skin preparation:  Chlorhexidine Sedation:    Sedation type:   None Anesthesia:    Anesthesia method:  Local infiltration   Local anesthetic:  Lidocaine 1% WITH epi Procedure details:    Placement location:  L lateral   Scalpel size:  11   Ultrasound guidance: no     Tension pneumothorax: no     Tube connected to:  Suction   Drainage characteristics:  Air only   Suture material:  2-0 silk   Dressing:  4x4 sterile gauze Post-procedure details:    Post-insertion x-ray findings: tube in good position     Procedure completion:  Tolerated Comments:     14 Fr pig tail catheter .Critical Care  Performed by: Rada Hay, MD Authorized by: Rada Hay, MD   Critical care provider statement:    Critical care time (minutes):  30   Critical care was time spent personally by me on the following activities:  Development of treatment plan with patient or surrogate, discussions with consultants, evaluation of patient's response to treatment, examination of patient, ordering and review of laboratory studies, ordering and review of radiographic studies, ordering and performing treatments and interventions, pulse oximetry, re-evaluation of patient's condition and review of old charts   The patient is on the cardiac monitor to evaluate for evidence of arrhythmia and/or significant heart rate changes.   MEDICATIONS ORDERED IN ED: Medications  lidocaine-EPINEPHrine (XYLOCAINE W/EPI) 1 %-1:100000 (with pres) injection 20 mL (20 mLs Infiltration Given by Other 04/24/22 1648)  fentaNYL (SUBLIMAZE) injection 50 mcg (50 mcg Intravenous Given 04/24/22 1648)  iohexol (OMNIPAQUE) 300 MG/ML solution 100 mL (100 mLs Intravenous Contrast Given 04/24/22 1716)  HYDROmorphone (DILAUDID) injection 0.5 mg (0.5 mg Intravenous Given 04/24/22 1745)     IMPRESSION / MDM / ASSESSMENT AND PLAN / ED COURSE  I reviewed the triage vital signs and the nursing notes.                              Patient's presentation is most consistent with acute presentation with  potential threat to life or bodily function.  Differential diagnosis includes, but is not limited to, rib fracture, pneumothorax, hemothorax, mediastinal injury  Patient is a 61 year old male presents delayed after a fall yesterday.  Patient was working on a roof did not fall all the way to the ground but did fall about the level of his height onto his left chest did not hit his head.  Seen urgent care today and x-ray performed which demonstrates a large left pneumothorax.  I evaluated the patient immediately blood pressure stable he is not hypoxic does look uncomfortable especially with breathing.  He has tenderness to the left chest wall but otherwise no signs of trauma.  I placed a 15 French pigtail with reexpansion of the lung on repeat chest x-ray.  Spoke with Dr. Bobbye Morton at [facility] trauma center who agrees to except the patient.  Will obtain CT head C-spine chest abdomen pelvis.  Patient giving fentanyl and Dilaudid for pain.      FINAL CLINICAL IMPRESSION(S) / ED DIAGNOSES   Final diagnoses:  Traumatic pneumothorax, initial encounter     Rx / DC Orders   ED Discharge Orders     None        Note:  This document was prepared using Dragon voice recognition software and may include unintentional dictation errors.   Rada Hay, MD 04/24/22 1746    Rada Hay, MD 04/24/22 1754

## 2022-04-24 NOTE — ED Triage Notes (Signed)
Pt reports left side rib cage pain after a fall on a roof yesterday morning. Pt states the pain increases with movement and feels like he can't take a deep breath without pain. Took a Vicodin and alternating Ibuprofen and Tylenol for pain

## 2022-04-24 NOTE — ED Notes (Signed)
Called [facility] spoke to [place] for transfer  [phone]

## 2022-04-24 NOTE — ED Triage Notes (Signed)
Pt to ED via POV from [facility]. Per pt, he was working on a roof yesterday and had a safety harness on, pt slipped and fell, landing on the metal part of the harness. Pt was not seen yesterday after falling. Pt states that the pain has been ongoing. Pt states that it is painful to take a deep breath but he does not feel short of breath. Pt SpO2 95% on room air.

## 2022-04-24 NOTE — Progress Notes (Signed)
Trauma Event Note    TRN at bedside to round on new admit/transfer from [facility]. Admits to hospital for left sided pneumothorax and rib fractures 7&8 on left side as a result of a fall on a roof, fall back onto safety harness per outside hospital records. Pigtail chesttube placed by outside hospital I noted CT clamped on my arrival to bedside, I unclamped. Air only in [place]. To wall suction.    Last imported Vital Signs BP (P) 126/85   Pulse (P) 64   Temp (P) 98 F (36.7 C)   Resp 16   Trending CBC Recent Labs    04/24/22 1625  WBC 9.1  HGB 15.4  HCT 47.5  PLT 227    Trending Coag's Recent Labs    04/24/22 1625  APTT 27  INR 1.0    Trending BMET Recent Labs    04/24/22 1625  NA 139  K 3.4*  CL 105  CO2 25  BUN 18  CREATININE 0.80  GLUCOSE 99      Dustin Little [name]  Trauma Response RN  Please call TRN at [phone] for further assistance.

## 2022-04-25 ENCOUNTER — Inpatient Hospital Stay (HOSPITAL_COMMUNITY): Payer: 59

## 2022-04-25 DIAGNOSIS — J939 Pneumothorax, unspecified: Secondary | ICD-10-CM | POA: Diagnosis not present

## 2022-04-25 LAB — CBC
HCT: 42.6 % (ref 39.0–52.0)
Hemoglobin: 14.5 g/dL (ref 13.0–17.0)
MCH: 31.9 pg (ref 26.0–34.0)
MCHC: 34 g/dL (ref 30.0–36.0)
MCV: 93.8 fL (ref 80.0–100.0)
Platelets: 183 10*3/uL (ref 150–400)
RBC: 4.54 MIL/uL (ref 4.22–5.81)
RDW: 11.6 % (ref 11.5–15.5)
WBC: 6.6 10*3/uL (ref 4.0–10.5)
nRBC: 0 % (ref 0.0–0.2)

## 2022-04-25 LAB — BASIC METABOLIC PANEL
Anion gap: 5 (ref 5–15)
BUN: 13 mg/dL (ref 6–20)
CO2: 25 mmol/L (ref 22–32)
Calcium: 8.4 mg/dL — ABNORMAL LOW (ref 8.9–10.3)
Chloride: 103 mmol/L (ref 98–111)
Creatinine, Ser: 0.9 mg/dL (ref 0.61–1.24)
GFR, Estimated: >60 mL/min (ref >60)
Glucose, Bld: 95 mg/dL (ref 70–99)
Potassium: 3.4 mmol/L — ABNORMAL LOW (ref 3.5–5.1)
Sodium: 133 mmol/L — ABNORMAL LOW (ref 135–145)

## 2022-04-25 MED ORDER — AMPHETAMINE-DEXTROAMPHET ER 5 MG PO CP24: 45.0000 mg | ORAL_CAPSULE | Freq: Every morning | ORAL | Status: DC

## 2022-04-25 MED ORDER — BUPROPION HCL ER (XL) 150 MG PO TB24
300.0000 mg | ORAL_TABLET | Freq: Every day | ORAL | Status: DC
Start: 2022-04-25 — End: 2022-05-02
  Administered 2022-04-25 – 2022-05-02 (×8): 300 mg via ORAL
  Filled 2022-04-25 (×8): qty 2

## 2022-04-25 MED ORDER — AMPHETAMINE-DEXTROAMPHET ER 10 MG PO CP24
40.0000 mg | ORAL_CAPSULE | Freq: Every morning | ORAL | Status: DC
  Administered 2022-04-25 – 2022-04-27 (×3): 40 mg via ORAL
  Filled 2022-04-25 (×4): qty 4
  Filled 2022-04-25: qty 2

## 2022-04-25 MED ORDER — MORPHINE SULFATE (PF) 2 MG/ML IV SOLN
2.0000 mg | INTRAVENOUS | Status: DC | PRN
  Administered 2022-04-25 – 2022-04-28 (×4): 2 mg via INTRAVENOUS
  Administered 2022-04-30: 4 mg via INTRAVENOUS
  Filled 2022-04-25 (×4): qty 1
  Filled 2022-04-25: qty 2
  Filled 2022-04-25: qty 1

## 2022-04-25 MED ORDER — POTASSIUM CHLORIDE CRYS ER 20 MEQ PO TBCR
40.0000 meq | EXTENDED_RELEASE_TABLET | ORAL | Status: AC
  Administered 2022-04-25 (×2): 40 meq via ORAL
  Filled 2022-04-25 (×2): qty 2

## 2022-04-25 NOTE — TOC Initial Note (Signed)
Transition of Care [facility]) - Initial/Assessment Note    Patient Details  Name: Dustin Little MRN: [id number] Date of Birth: [birth year]  Transition of [facility]) CM/SW Contact:    Glennon Mac, RN Phone Number: 04/25/2022, 4:03 PM  Clinical Narrative:                 Patient admitted on 04/24/2022 after a fall from roof while working, striking his left chest on a retractable support device.  He sustained left rib fractures with pneumothorax, requiring chest tube.  Prior to admission, patient independent and living at home with spouse.  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  Patient states that if this changes, he will alert TOC case manager.  Expected Discharge Plan: Home/Self Care Barriers to Discharge: Continued Medical Work up   Patient Goals and CMS Choice Patient states their goals for this hospitalization and ongoing recovery are:: to go home      Expected Discharge Plan and Services Expected Discharge Plan: Home/Self Care   Discharge Planning Services: CM Consult   Living arrangements for the past 2 months: Single Family Home                                      Prior Living Arrangements/Services Living arrangements for the past 2 months: Single Family Home Lives with:: Spouse Patient language and need for interpreter reviewed:: Yes Do you feel safe going back to the place where you live?: Yes      Need for Family Participation in Patient Care: Yes (Comment) Care giver support system in place?: Yes (comment)   Criminal Activity/Legal Involvement Pertinent to Current Situation/Hospitalization: No - Comment as needed  Activities of Daily Living Home Assistive Devices/Equipment: None ADL Screening (condition at time of admission) Patient's cognitive ability adequate to safely complete daily activities?: Yes Is the patient deaf or have difficulty hearing?: No Does the patient have difficulty  seeing, even when wearing glasses/contacts?: No Does the patient have difficulty concentrating, remembering, or making decisions?: No Patient able to express need for assistance with ADLs?: Yes Does the patient have difficulty dressing or bathing?: No Independently performs ADLs?: No Communication: Independent Dressing (OT): Needs assistance Is this a change from baseline?: Change from baseline, expected to last <3days Grooming: Independent Feeding: Independent Bathing: Needs assistance Is this a change from baseline?: Change from baseline, expected to last <3 days Toileting: Needs assistance Is this a change from baseline?: Change from baseline, expected to last <3 days In/Out Bed: Needs assistance Is this a change from baseline?: Change from baseline, expected to last <3 days Walks in Home: Needs assistance Is this a change from baseline?: Change from baseline, expected to last <3 days Does the patient have difficulty walking or climbing stairs?: Yes Weakness of Legs: None Weakness of Arms/Hands: None                 Emotional Assessment Appearance:: Appears stated age Attitude/Demeanor/Rapport: Engaged Affect (typically observed): Accepting Orientation: : Oriented to Self, Oriented to Place, Oriented to  Time, Oriented to Situation      Admission diagnosis:  Closed traumatic fracture of ribs of left side with pneumothorax [S22.42XA, S27.0XXA] Patient Active Problem List   Diagnosis Date Noted   Closed traumatic fracture of ribs of left side with pneumothorax 04/24/2022   ADHD (attention deficit hyperactivity disorder) 04/19/2018  Depression, major, single episode, complete remission ([facility]) 05/10/2016   Gastroesophageal reflux disease without esophagitis 05/10/2016   H/O attention deficit hyperactivity disorder 05/10/2016   BPH with obstruction/lower urinary tract symptoms 03/05/2016   Erectile dysfunction 03/05/2016   Corneal scar, left eye 03/26/2013   Keratitis  04/29/2012   PCP:  Mick Sell, MD Pharmacy:   [id number] [place] - [address] AT [facility] OF [address] [facility] [place] [address] Phone: [phone] Fax: [phone]  [facility] [address], [place] - [address] [place] [address] Phone: [phone] Fax: [phone]     Social Determinants of Health (SDOH) Interventions    Readmission Risk Interventions     No data to display         Quintella Baton, RN, BSN  Trauma/Neuro ICU Case Manager [phone]

## 2022-04-25 NOTE — Plan of Care (Signed)

## 2022-04-25 NOTE — Progress Notes (Addendum)
Trauma Event Note    TRN at bedside to round. Family at bedside. VSS, chest tube to wall suction, no leaks, new drainage noted in [place]. Answered questions for patient and family. Pending chest XRAY in AM.   Last imported Vital Signs BP (!) 139/92 (BP Location: Right Arm)   Pulse 73   Temp 97.8 F (36.6 C) (Oral)   Resp 20   SpO2 97%   Trending CBC Recent Labs    04/24/22 1625 04/25/22 0359  WBC 9.1 6.6  HGB 15.4 14.5  HCT 47.5 42.6  PLT 227 183    Trending Coag's Recent Labs    04/24/22 1625  APTT 27  INR 1.0    Trending BMET Recent Labs    04/24/22 1625 04/25/22 0359  NA 139 133*  K 3.4* 3.4*  CL 105 103  CO2 25 25  BUN 18 13  CREATININE 0.80 0.90  GLUCOSE 99 95      Dustin Little [name]  Trauma Response RN  Please call TRN at [phone] for further assistance.

## 2022-04-25 NOTE — H&P (Signed)
Dustin Little is an 61 y.o. male.   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  He fell and struck his left chest on his retractable support device.  Initially had some pain but continued working.  He then was evaluated at [facility].  He was found to have a large left pneumothorax and 2 rib fractures.  Chest tube was placed and he was accepted in transfer for care on the trauma service.  He has received some pain medication and says it is helping some.  His wife is at the bedside.  Past Medical History:  Diagnosis Date   Anxiety    BPH (benign prostatic hypertrophy)    BXO (balanitis xerotica obliterans)    Depression    ED (erectile dysfunction)    Heartburn    History of kidney stones    Hypogonadism in male     Past Surgical History:  Procedure Laterality Date   CHOLECYSTECTOMY     CORNEAL TRANSPLANT  02/04/2013   HERNIA REPAIR      Family History  Problem Relation Age of Onset   Cancer Unknown    Kidney disease Neg Hx    Prostate cancer Neg Hx    Social History:  reports that he has never smoked. He has never used smokeless tobacco. He reports current alcohol use. He reports that he does not use drugs.  Allergies:  Allergies  Allergen Reactions   Ambien [Zolpidem] Other (See Comments)    Suicidal thoughts   Orphenadrine Citrate Other (See Comments)    Blurred vision    Augmentin [Amoxicillin-Pot Clavulanate] Rash    Medications Prior to Admission  Medication Sig Dispense Refill   acetaminophen (TYLENOL) 500 MG tablet Take 1,000 mg by mouth every 6 (six) hours as needed for mild pain or moderate pain.     amphetamine-dextroamphetamine (ADDERALL XR) 15 MG 24 hr capsule Take 3 capsules (45 mg total) by mouth every morning for 30 days . (Patient taking differently: Take 45 mg by mouth daily.) 90 capsule 0   buPROPion (WELLBUTRIN XL) 300 MG 24 hr tablet TAKE 1 TABLET BY MOUTH ONCE  DAILY 90 tablet 3   ibuprofen (ADVIL) 200 MG tablet Take 800 mg by mouth every 6 (six) hours as needed for moderate pain or mild pain.     sildenafil (REVATIO) 20 MG tablet TAKE 3-5 TABLETS TWO HOURS PRIOR TO INTERCOURSE (Patient taking differently: Take 60-100 mg by mouth daily as needed (erectile dysfunction).) 90 tablet 3   ciprofloxacin (CIPRO) 500 MG tablet Take 1 tablet (500 mg total) by mouth 2 (two) times daily. (Patient not taking: Reported on 04/25/2022) 14 tablet 0    Results for orders placed or performed during the hospital encounter of 04/24/22 (from the past 48 hour(s))  Comprehensive metabolic panel     Status: Abnormal   Collection Time: 04/24/22  4:25 PM  Result Value Ref Range   Sodium 139 135 - 145 mmol/L   Potassium 3.4 (L) 3.5 - 5.1 mmol/L   Chloride 105 98 - 111 mmol/L   CO2 25 22 - 32 mmol/L   Glucose, Bld 99 70 - 99 mg/dL    Comment: Glucose reference range applies only to samples taken after fasting for at least 8 hours.   BUN 18 6 - 20 mg/dL   Creatinine, Ser 0.80 0.61 - 1.24 mg/dL   Calcium 9.4 8.9 - 10.3 mg/dL  Total Protein 7.7 6.5 - 8.1 g/dL   Albumin 4.7 3.5 - 5.0 g/dL   AST 20 15 - 41 U/L   ALT 18 0 - 44 U/L   Alkaline Phosphatase 70 38 - 126 U/L   Total Bilirubin 1.0 0.3 - 1.2 mg/dL   GFR, Estimated >60 >60 mL/min    Comment: (NOTE) Calculated using the CKD-EPI Creatinine Equation (2021)    Anion gap 9 5 - 15    Comment: Performed at [facility], [address] [place], [place] [address]  CBC with Differential     Status: None   Collection Time: 04/24/22  4:25 PM  Result Value Ref Range   WBC 9.1 4.0 - 10.5 K/uL   RBC 4.99 4.22 - 5.81 MIL/uL   Hemoglobin 15.4 13.0 - 17.0 g/dL   HCT 47.5 39.0 - 52.0 %   MCV 95.2 80.0 - 100.0 fL   MCH 30.9 26.0 - 34.0 pg   MCHC 32.4 30.0 - 36.0 g/dL   RDW 11.8 11.5 - 15.5 %   Platelets 227 150 - 400 K/uL   nRBC 0.0 0.0 - 0.2 %   Neutrophils Relative % 55 %   Neutro Abs 5.0 1.7 - 7.7 K/uL    Lymphocytes Relative 30 %   Lymphs Abs 2.8 0.7 - 4.0 K/uL   Monocytes Relative 9 %   Monocytes Absolute 0.8 0.1 - 1.0 K/uL   Eosinophils Relative 5 %   Eosinophils Absolute 0.4 0.0 - 0.5 K/uL   Basophils Relative 1 %   Basophils Absolute 0.1 0.0 - 0.1 K/uL   Immature Granulocytes 0 %   Abs Immature Granulocytes 0.03 0.00 - 0.07 K/uL    Comment: Performed at [facility], [address] [place], [place] [address]  Protime-INR     Status: None   Collection Time: 04/24/22  4:25 PM  Result Value Ref Range   Prothrombin Time 13.3 11.4 - 15.2 seconds   INR 1.0 0.8 - 1.2    Comment: (NOTE) INR goal varies based on device and disease states. Performed at [facility], [address] [place], [place] [address]   APTT     Status: None   Collection Time: 04/24/22  4:25 PM  Result Value Ref Range   aPTT 27 24 - 36 seconds    Comment: Performed at [facility], [address] [place], [place] [address]  Type and screen     Status: None   Collection Time: 04/24/22  7:54 PM  Result Value Ref Range   ABO/RH(D) O POS    Antibody Screen NEG    Sample Expiration      04/27/2022,2359 Performed at [facility], [address], [place], [place] [address]    DG Chest Port 1 View  Result Date: 04/25/2022 CLINICAL DATA:  Chest tube in place. EXAM: PORTABLE CHEST 1 VIEW COMPARISON:  None Available. FINDINGS: The heart size is normal. Atherosclerotic calcifications are present at the aortic arch. No edema or effusion is present. Left-sided chest tube is in place. A small left apical pneumothorax is stable. IMPRESSION: 1. Stable small left apical pneumothorax with left-sided chest tube in place. 2. Atherosclerosis. Electronically Signed   By: San Morelle M.D.   On: 04/25/2022 08:24   CT CHEST ABDOMEN PELVIS W CONTRAST  Result Date: 04/24/2022 CLINICAL DATA:  Polytrauma, blunt. Pt reports left side rib cage pain after a fall on a roof yesterday  morning. Pt states the pain increases with  movement and feels like he can't take a deep breath EXAM: CT CHEST, ABDOMEN, AND PELVIS WITH CONTRAST TECHNIQUE: Multidetector CT imaging of the chest, abdomen and pelvis was performed following the standard protocol during bolus administration of intravenous contrast. RADIATION DOSE REDUCTION: This exam was performed according to the departmental dose-optimization program which includes automated exposure control, adjustment of the mA and/or kV according to patient size and/or use of iterative reconstruction technique. CONTRAST:  123m OMNIPAQUE IOHEXOL 300 MG/ML  SOLN COMPARISON:  Chest x-ray 04/04/2022 FINDINGS: CHEST: Ports and Devices: None. Lungs/airways: Right lower lobe subsegmental atelectasis. Lingular and left lower lobe patchy linear airspace opacity likely represents atelectasis. No pulmonary nodule. No pulmonary mass. No pulmonary contusion or laceration. No pneumatocele formation. The central airways are patent. Pleura: No pleural effusion. At least small volume left pneumothorax with a left chest tube pigtail catheter in appropriate position along the anterolateral left lung. No right pneumothorax. No hemothorax. Lymph Nodes: No mediastinal, hilar, or axillary lymphadenopathy. Mediastinum: No pneumomediastinum. No aortic injury or mediastinal hematoma. The thoracic aorta is normal in caliber. The heart is normal in size. No significant pericardial effusion. The esophagus is unremarkable.  Tiny hiatal hernia. The thyroid is unremarkable. Chest Wall / Breasts: No chest wall mass. Trace left chest wall subcutaneus soft tissue emphysema. Musculoskeletal: Minimally displaced left 7 and 8 rib fractures. No acute displaced sternal fracture. No spinal fracture. ABDOMEN / PELVIS: Liver: The liver is enlarged measuring up to 20 cm. No focal lesion. No laceration or subcapsular hematoma. Biliary System: Status post cholecystectomy. No biliary ductal dilatation.  Pancreas: Normal pancreatic contour. No main pancreatic duct dilatation. Spleen: Not enlarged. No focal lesion. No laceration, subcapsular hematoma, or vascular injury. Adrenal Glands: No nodularity bilaterally. Kidneys: Bilateral kidneys enhance symmetrically. No hydronephrosis. No contusion, laceration, or subcapsular hematoma. Subcentimeter hypodensities too small characterize. No injury to the vascular structures or collecting systems. No hydroureter. Right posterior 4 cm urinary bladder diverticula. Otherwise the the urinary bladder is unremarkable. Bowel: . Fecalized material within the distal ileum suggestive of a slow transition state versus incompetent ileocecal bowel. Increased stool burden within the cecum and ascending colon. Stool otherwise noted throughout the majority of the colon. No small or large bowel wall thickening or dilatation. The appendix is unremarkable. Mesentery, Omentum, and Peritoneum: No simple free fluid ascites. No pneumoperitoneum. No hemoperitoneum. No mesenteric hematoma identified. No organized fluid collection. Pelvic Organs: Prostate is unremarkable. Lymph Nodes: No abdominal, pelvic, inguinal lymphadenopathy. Vasculature: No abdominal aorta or iliac aneurysm. No active contrast extravasation or pseudoaneurysm. Musculoskeletal: No significant soft tissue hematoma. No acute pelvic fracture. No spinal fracture. IMPRESSION: 1. At least small volume left pneumothorax with a left chest tube pigtail catheter in the appropriate position. 2. Associated minimally displaced left lateral seventh and eighth rib fractures. 3. No acute traumatic injury to the abdomen or pelvis. 4. No acute fracture or traumatic malalignment of the thoracic or lumbar spine. 5. Other imaging findings of potential clinical significance: Tiny hiatal hernia. Hepatomegaly. Fecalized material within the distal ileum suggestive of a slow transition state versus incompetent ileocecal bowel. Constipation.  Electronically Signed   By: MIven FinnM.D.   On: 04/24/2022 17:53   CT HEAD WO CONTRAST (5MM)  Result Date: 04/24/2022 CLINICAL DATA:  Fall from roof.  Head trauma EXAM: CT HEAD WITHOUT CONTRAST CT CERVICAL SPINE WITHOUT CONTRAST TECHNIQUE: Multidetector CT imaging of the head and cervical spine was performed following the standard protocol without intravenous contrast. Multiplanar CT image reconstructions of  the cervical spine were also generated. RADIATION DOSE REDUCTION: This exam was performed according to the departmental dose-optimization program which includes automated exposure control, adjustment of the mA and/or kV according to patient size and/or use of iterative reconstruction technique. COMPARISON:  None Available. FINDINGS: CT HEAD FINDINGS Brain: No evidence of acute infarction, hemorrhage, hydrocephalus, extra-axial collection or mass lesion/mass effect. Vascular: Negative for hyperdense vessel Skull: Negative for fracture Sinuses/Orbits: Mild mucosal edema paranasal sinuses. Negative orbit Other: None CT CERVICAL SPINE FINDINGS Alignment: Normal Skull base and vertebrae: Negative for fracture Soft tissues and spinal canal: Negative Disc levels: Disc degeneration and spurring most prominent C5-6 and C6-7. Facet degeneration on the right at C7-T1. No significant stenosis. Upper chest: Left apical pneumothorax, small Other: None IMPRESSION: Negative for cervical spine fracture Small left apical pneumothorax. Electronically Signed   By: Franchot Gallo M.D.   On: 04/24/2022 17:43   CT Cervical Spine Wo Contrast  Result Date: 04/24/2022 CLINICAL DATA:  Fall from roof.  Head trauma EXAM: CT HEAD WITHOUT CONTRAST CT CERVICAL SPINE WITHOUT CONTRAST TECHNIQUE: Multidetector CT imaging of the head and cervical spine was performed following the standard protocol without intravenous contrast. Multiplanar CT image reconstructions of the cervical spine were also generated. RADIATION DOSE REDUCTION:  This exam was performed according to the departmental dose-optimization program which includes automated exposure control, adjustment of the mA and/or kV according to patient size and/or use of iterative reconstruction technique. COMPARISON:  None Available. FINDINGS: CT HEAD FINDINGS Brain: No evidence of acute infarction, hemorrhage, hydrocephalus, extra-axial collection or mass lesion/mass effect. Vascular: Negative for hyperdense vessel Skull: Negative for fracture Sinuses/Orbits: Mild mucosal edema paranasal sinuses. Negative orbit Other: None CT CERVICAL SPINE FINDINGS Alignment: Normal Skull base and vertebrae: Negative for fracture Soft tissues and spinal canal: Negative Disc levels: Disc degeneration and spurring most prominent C5-6 and C6-7. Facet degeneration on the right at C7-T1. No significant stenosis. Upper chest: Left apical pneumothorax, small Other: None IMPRESSION: Negative for cervical spine fracture Small left apical pneumothorax. Electronically Signed   By: Franchot Gallo M.D.   On: 04/24/2022 17:43   DG Chest Portable 1 View  Result Date: 04/24/2022 CLINICAL DATA:  Chest tube insertion.  Fall.  Left pneumothorax EXAM: PORTABLE CHEST 1 VIEW COMPARISON:  04/24/2022 FINDINGS: Interval placement of pigtail chest tube on the left in good position. Re-expansion left lung. Small residual left apical pneumothorax measuring 22 mm in thickness. Minimal left effusion. Mild left lower lobe atelectasis. Right lung is clear.  Negative for heart failure. IMPRESSION: Interval placement of left chest tube with near complete re-expansion left lung. 22 mm residual left apical pneumothorax. Electronically Signed   By: Franchot Gallo M.D.   On: 04/24/2022 17:22   DG Ribs Unilateral W/Chest Left  Result Date: 04/24/2022 CLINICAL DATA:  Trauma fall EXAM: LEFT RIBS AND CHEST - 3+ VIEW COMPARISON:  None Available. FINDINGS: There is large left pneumothorax with marked atelectasis in the left lung. There is  mild displacement of mediastinum to the right. There is no definite flattening of left hemidiaphragm. There is no pleural effusion. Cardiac size is within normal limits. Linear densities in the medial left lower lung fields suggest atelectasis. Minimally displaced fractures are seen in the posterolateral aspect of left seventh and eighth ribs. There may be other undisplaced fractures in the adjacent ribs. IMPRESSION: Large left pneumothorax with partial atelectasis of left lung. There is mild shift of mediastinum to the right which may be due to rotation in the  radiograph or suggest tension pneumothorax. Fractures are seen in the posterolateral aspect of left seventh and eighth ribs. Imaging finding of large left pneumothorax was relayed to patient's provider Margarette Canada by telephone call. Electronically Signed   By: Elmer Picker M.D.   On: 04/24/2022 15:56    Review of Systems  Constitutional: Negative.   HENT: Negative.    Eyes: Negative.   Respiratory:         See HPI  Cardiovascular:  Positive for chest pain.  Endocrine: Negative.   Genitourinary: Negative.   Musculoskeletal:        Left-sided chest wall pain  Allergic/Immunologic: Negative.   Neurological: Negative.   Hematological: Negative.   Psychiatric/Behavioral: Negative.      Blood pressure 110/69, pulse 64, temperature 97.7 F (36.5 C), temperature source Oral, resp. rate 20, SpO2 94 %. Physical Exam Constitutional:      Appearance: Normal appearance.  HENT:     Head: Normocephalic.     Nose: Nose normal.     Mouth/Throat:     Mouth: Mucous membranes are moist.  Eyes:     General: No scleral icterus.    Extraocular Movements: Extraocular movements intact.     Pupils: Pupils are equal, round, and reactive to light.  Cardiovascular:     Rate and Rhythm: Normal rate and regular rhythm.     Pulses: Normal pulses.     Heart sounds: Normal heart sounds.  Pulmonary:     Effort: Pulmonary effort is normal.      Breath sounds: Normal breath sounds. No wheezing or rales.     Comments: Clear bilaterally, left chest tube in place with no airleak Chest:     Chest wall: Tenderness present.  Abdominal:     General: Abdomen is flat. There is no distension.     Palpations: Abdomen is soft.     Tenderness: There is no abdominal tenderness. There is no guarding or rebound.  Musculoskeletal:        General: No tenderness.     Cervical back: Normal range of motion. No tenderness.  Skin:    General: Skin is warm.     Capillary Refill: Capillary refill takes 2 to 3 seconds.  Neurological:     Mental Status: He is alert and oriented to person, place, and time.     Cranial Nerves: No cranial nerve deficit.     Comments: GCS 15  Psychiatric:        Mood and Affect: Mood normal.      Assessment/Plan Fall Rib FX L 7-8 with PTX - continue chest tube to -20 today, chest x-ray in a.m., multimodal pain control and pulmonary toilet.  He pulled over 2000 on IS for me today.   Admit to inpatient. I spoke with his wife at the bedside. Zenovia Jarred, MD 04/25/2022, 8:49 AM

## 2022-04-26 ENCOUNTER — Inpatient Hospital Stay (HOSPITAL_COMMUNITY): Payer: 59

## 2022-04-26 DIAGNOSIS — J939 Pneumothorax, unspecified: Secondary | ICD-10-CM | POA: Diagnosis not present

## 2022-04-26 MED ORDER — ACETAMINOPHEN 500 MG PO TABS
1000.0000 mg | ORAL_TABLET | Freq: Four times a day (QID) | ORAL | Status: DC
  Administered 2022-04-26 – 2022-05-02 (×19): 1000 mg via ORAL
  Filled 2022-04-26 (×22): qty 2

## 2022-04-26 MED ORDER — LIDOCAINE 5 % EX PTCH
1.0000 | MEDICATED_PATCH | CUTANEOUS | Status: DC
  Administered 2022-04-26 – 2022-05-02 (×7): 1 via TRANSDERMAL
  Filled 2022-04-26 (×7): qty 1

## 2022-04-26 MED ORDER — METHOCARBAMOL 500 MG PO TABS
500.0000 mg | ORAL_TABLET | Freq: Three times a day (TID) | ORAL | Status: DC
  Administered 2022-04-26 – 2022-04-28 (×7): 500 mg via ORAL
  Filled 2022-04-26 (×7): qty 1

## 2022-04-27 ENCOUNTER — Inpatient Hospital Stay (HOSPITAL_COMMUNITY): Payer: 59

## 2022-04-27 LAB — CBC
HCT: 45.3 % (ref 39.0–52.0)
Hemoglobin: 15.6 g/dL (ref 13.0–17.0)
MCH: 32.1 pg (ref 26.0–34.0)
MCHC: 34.4 g/dL (ref 30.0–36.0)
MCV: 93.2 fL (ref 80.0–100.0)
Platelets: 228 10*3/uL (ref 150–400)
RBC: 4.86 MIL/uL (ref 4.22–5.81)
RDW: 11.2 % — ABNORMAL LOW (ref 11.5–15.5)
WBC: 7.7 10*3/uL (ref 4.0–10.5)
nRBC: 0 % (ref 0.0–0.2)

## 2022-04-27 LAB — BASIC METABOLIC PANEL
Anion gap: 11 (ref 5–15)
BUN: 13 mg/dL (ref 6–20)
CO2: 23 mmol/L (ref 22–32)
Calcium: 9.4 mg/dL (ref 8.9–10.3)
Chloride: 104 mmol/L (ref 98–111)
Creatinine, Ser: 0.91 mg/dL (ref 0.61–1.24)
GFR, Estimated: >60 mL/min (ref >60)
Glucose, Bld: 105 mg/dL — ABNORMAL HIGH (ref 70–99)
Potassium: 4.2 mmol/L (ref 3.5–5.1)
Sodium: 138 mmol/L (ref 135–145)

## 2022-04-27 MED ORDER — HYDROMORPHONE HCL 1 MG/ML IJ SOLN
1.0000 mg | Freq: Once | INTRAMUSCULAR | Status: AC
  Administered 2022-04-27: 1 mg via INTRAVENOUS
  Filled 2022-04-27: qty 1

## 2022-04-27 MED ORDER — HYDROXYZINE HCL 10 MG PO TABS
10.0000 mg | ORAL_TABLET | Freq: Three times a day (TID) | ORAL | Status: DC | PRN
  Administered 2022-04-29 – 2022-05-02 (×6): 10 mg via ORAL
  Filled 2022-04-27 (×7): qty 1

## 2022-04-27 MED ORDER — AMPHETAMINE-DEXTROAMPHET ER 10 MG PO CP24
40.0000 mg | ORAL_CAPSULE | Freq: Every morning | ORAL | Status: DC
  Administered 2022-04-28 – 2022-05-02 (×4): 40 mg via ORAL
  Filled 2022-04-27 (×4): qty 4

## 2022-04-27 MED ORDER — LORAZEPAM 2 MG/ML IJ SOLN
1.0000 mg | Freq: Once | INTRAMUSCULAR | Status: AC
  Administered 2022-04-27: 1 mg via INTRAVENOUS
  Filled 2022-04-27: qty 1

## 2022-04-27 NOTE — Progress Notes (Signed)
Worsening L PTX on XR this afternoon. Patient not requiring oxygen. Discussed findings with patient and plan to perform left chest tube insertion at bedside. Patient not having worsening pain but has been very anxious today -wrote for 1 time pain medication and anxiolytic for procedure -await nursing personnel for bedside procedure -plan for post procedure XR

## 2022-04-28 ENCOUNTER — Inpatient Hospital Stay (HOSPITAL_COMMUNITY): Payer: 59

## 2022-04-28 MED ORDER — ORAL CARE MOUTH RINSE: 15.0000 mL | OROMUCOSAL | Status: DC | PRN

## 2022-04-28 MED ORDER — KETOROLAC TROMETHAMINE 15 MG/ML IJ SOLN
30.0000 mg | Freq: Four times a day (QID) | INTRAMUSCULAR | Status: DC
  Administered 2022-04-28 – 2022-05-02 (×17): 30 mg via INTRAVENOUS
  Filled 2022-04-28 (×17): qty 2

## 2022-04-28 MED ORDER — METHOCARBAMOL 500 MG PO TABS
1000.0000 mg | ORAL_TABLET | Freq: Three times a day (TID) | ORAL | Status: DC
  Administered 2022-04-28 – 2022-05-02 (×9): 1000 mg via ORAL
  Filled 2022-04-28 (×11): qty 2

## 2022-04-29 ENCOUNTER — Inpatient Hospital Stay (HOSPITAL_COMMUNITY): Payer: 59

## 2022-04-29 LAB — GLUCOSE, CAPILLARY: Glucose-Capillary: 82 mg/dL (ref 70–99)

## 2022-04-29 NOTE — Progress Notes (Signed)
Mobility Specialist Progress Note   04/29/22 1551  Mobility  Activity Ambulated independently in hallway  Level of Assistance Independent  Assistive Device None  Distance Ambulated (ft) 1500 ft  Activity Response Tolerated well  $Mobility charge 1 Mobility   Received pt in bed having no complaints and agreeable to mobility. Pt was asymptomatic throughout ambulation and returned to room w/o fault. Left in chair w/ call bell in reach and all needs met.  Frederico Hamman Mobility Specialist Phone Number [phone]

## 2022-04-29 NOTE — Progress Notes (Signed)
Progress Note     Subjective: Pt reports pain in L ribs but overall stable. Discussed CXR from this AM and recommendation to stay on suction another 24 hrs. He and his wife are in agreement. Tolerating diet and having bowel function.   Objective: Vital signs in last 24 hours: Temp:  [97.7 F (36.5 C)-98.5 F (36.9 C)] 98 F (36.7 C) (06/26 0811) Pulse Rate:  [70-89] 89 (06/26 0811) Resp:  [16-20] 18 (06/26 0400) BP: (105-146)/(69-83) 130/83 (06/26 0811) SpO2:  [95 %-100 %] 100 % (06/26 0811) Last BM Date : 04/27/22  Intake/Output from previous day: 06/25 0701 - 06/26 0700 In: 3 [I.V.:3] Out: 28 [Chest Tube:28] Intake/Output this shift: No intake/output data recorded.  PE: General: pleasant, WD, thin male who is laying in bed in NAD Heart: regular, rate, and rhythm.  Lungs: CTAB, no wheezes, rhonchi, or rales noted.  Respiratory effort nonlabored. L CT in place with minimal output, no air leak. Pulling 2500 on IS.  Abd: soft, NT, ND, +BS MS: all 4 extremities are symmetrical with no cyanosis, clubbing, or edema. Psych: A&Ox3 with an appropriate affect.    Lab Results:  Recent Labs    04/27/22 0232  WBC 7.7  HGB 15.6  HCT 45.3  PLT 228   BMET Recent Labs    04/27/22 0232  NA 138  K 4.2  CL 104  CO2 23  GLUCOSE 105*  BUN 13  CREATININE 0.91  CALCIUM 9.4   PT/INR No results for input(s): "LABPROT", "INR" in the last 72 hours. CMP     Component Value Date/Time   NA 138 04/27/2022 0232   K 4.2 04/27/2022 0232   CL 104 04/27/2022 0232   CO2 23 04/27/2022 0232   GLUCOSE 105 (H) 04/27/2022 0232   BUN 13 04/27/2022 0232   CREATININE 0.91 04/27/2022 0232   CALCIUM 9.4 04/27/2022 0232   PROT 7.7 04/24/2022 1625   ALBUMIN 4.7 04/24/2022 1625   AST 20 04/24/2022 1625   ALT 18 04/24/2022 1625   ALKPHOS 70 04/24/2022 1625   BILITOT 1.0 04/24/2022 1625   GFRNONAA >60 04/27/2022 0232   GFRAA >60 06/23/2016 1025   Lipase  No results found for:  "LIPASE"     Studies/Results: DG Chest Port 1 View  Result Date: 04/29/2022 CLINICAL DATA:  [id number] left pneumothorax, chest tube insertion, follow-up study EXAM: PORTABLE CHEST 1 VIEW COMPARISON:  Chest x-ray from yesterday at 3:49 p.m. FINDINGS: Again seen is the left-sided chest tube and is stable. There is minimal left apical pneumothorax seen and has decreased in the interim with a pneumothorax measuring approximally 3.3 cm in the craniocaudad dimension in comparison to 1.9 cm on the previous study. Otherwise, lungs are clear. The heart size and mediastinal contours are within normal limits. The visualized skeletal structures are unremarkable. IMPRESSION: Left-sided chest tube is stable. There is minimal left apical pneumothorax seen and has further decreased in the interim. Electronically Signed   By: Marjo Bicker M.D.   On: 04/29/2022 07:38   DG Chest Port 1 View  Result Date: 04/28/2022 CLINICAL DATA:  Follow-up pneumothorax. EXAM: PORTABLE CHEST 1 VIEW COMPARISON:  Chest x-ray earlier same date. FINDINGS: The left-sided chest tube is stable. Slight interval enlargement of the left apical pneumothorax. This is just below the level of the third anterior rib and was previously just above it. No acute pulmonary findings. Stable left rib fractures. IMPRESSION: Slight interval enlargement of the left apical pneumothorax. Electronically Signed  By: Rudie Meyer M.D.   On: 04/28/2022 16:07   DG CHEST PORT 1 VIEW  Result Date: 04/28/2022 CLINICAL DATA:  Pneumothorax EXAM: PORTABLE CHEST 1 VIEW COMPARISON:  April 27, 2022 FINDINGS: The left chest tube is stable. The left apical pneumothorax measures 14 mm today versus 12 mm yesterday, mildly larger in the interval. No right-sided pneumothorax. Persistent opacity in the left infrahilar region is similar. Subcutaneous emphysema in the left chest wall identified. No other interval changes or acute abnormalities. IMPRESSION: 1. Stable left chest tube. The  left apical pneumothorax is a little larger in the interval. Recommend continued attention on follow-up. 2. Opacity in the left infrahilar region is similar in the interval. Recommend continued attention on follow-up. Electronically Signed   By: Gerome Sam III M.D.   On: 04/28/2022 08:32   DG CHEST PORT 1 VIEW  Result Date: 04/27/2022 CLINICAL DATA:  Pneumothorax. EXAM: PORTABLE CHEST 1 VIEW COMPARISON:  04/27/2022. FINDINGS: The heart is normal in size and the mediastinal contour is stable. There has been interval placement of a left chest tube with a tiny residual apical pneumothorax and small pneumothorax at the left costophrenic angle. Mild atelectasis is noted at the left lung base. The right lung is clear. A rib fracture is noted at T8 on the left. IMPRESSION: Interval placement of left chest tube with trace residual apical and left costophrenic angle pneumothorax. Electronically Signed   By: Thornell Sartorius M.D.   On: 04/27/2022 21:43   DG CHEST PORT 1 VIEW  Result Date: 04/27/2022 CLINICAL DATA:  Pneumothorax EXAM: PORTABLE CHEST 1 VIEW COMPARISON:  04/27/2022 FINDINGS: Progression of left apical pneumothorax now 5.8 cm, previously 3.8 cm in thickness. Small component in the left lung base has progressed. No effusion. Left lower lobe atelectasis unchanged. No mediastinal shift. Right lung clear. IMPRESSION: Progressive left apical pneumothorax. Progression of left lateral base component of pneumothorax. Electronically Signed   By: Marlan Palau M.D.   On: 04/27/2022 15:27    Anti-infectives: Anti-infectives (From admission, onward)    None        Assessment/Plan 61 yo male s/p fall   L rib fractures 7-8 - Multimodal pain control, IS, pulmonary toilet L pneumothorax - Chest tube removed 6/23 due to stable CXR and poorly placed tube. CXR this AM with minimal left apical PTX, discussed with attending MD who recommended keeping on -40 suction today and repeat CXR in AM.    FEN:  regular diet, SLIV ID: no current abx VTE: lovenox, SCDs   Dispo: 4NP, repeat CXR in AM  LOS: 5 days   I reviewed last 24 h vitals and pain scores, last 48 h intake and output, last 24 h labs and trends, and last 24 h imaging results.     Juliet Rude, [facility] 04/29/2022, 10:48 AM Please see Amion for pager number during day hours 7:00am-4:30pm

## 2022-04-30 ENCOUNTER — Inpatient Hospital Stay (HOSPITAL_COMMUNITY): Payer: 59

## 2022-05-01 ENCOUNTER — Inpatient Hospital Stay (HOSPITAL_COMMUNITY): Payer: 59

## 2022-05-01 NOTE — Progress Notes (Signed)
   Trauma/Critical Care Follow Up Note  Subjective:    Overnight Issues:   Objective:  Vital signs for last 24 hours: Temp:  [97.3 F (36.3 C)-98.6 F (37 C)] 97.9 F (36.6 C) (06/28 0804) Pulse Rate:  [66-76] 76 (06/28 0804) Resp:  [14-20] 18 (06/28 0804) BP: (90-120)/(55-75) 117/75 (06/28 0804) SpO2:  [92 %-98 %] 97 % (06/28 0804)  Hemodynamic parameters for last 24 hours:    Intake/Output from previous day: 06/27 0701 - 06/28 0700 In: 503 [P.O.:500; I.V.:3] Out: -   Intake/Output this shift: No intake/output data recorded.  Vent settings for last 24 hours:    Physical Exam:  Gen: comfortable, no distress Neuro: non-focal exam HEENT: PERRL Neck: supple CV: RRR Pulm: unlabored breathing Abd: soft, NT GU: clear yellow urine Extr: wwp, no edema   No results found for this or any previous visit (from the past 24 hour(s)).  Assessment & Plan: The plan of care was discussed with the bedside nurse for the day, who is in agreement with this plan and no additional concerns were raised.   Present on Admission:  Closed traumatic fracture of ribs of left side with pneumothorax    LOS: 7 days   Additional comments:I reviewed the patient's new clinical lab test results.   and I reviewed the patients new imaging test results.    61 yo male s/p fall   L rib fractures 7-8 - Multimodal pain control, IS, pulmonary toilet L pneumothorax - CT removed 6/23 due to stable CXR and poorly placed tube. CXR this AM with worsened left apical PTX, back to -40 suction today, CXR in AM   FEN: regular diet, SLIV ID: no current abx VTE: lovenox, SCDs   Dispo: 4NP, repeat CXR in AM   Jesusita Oka, MD Trauma & General Surgery Please use [email or web address] to contact on call provider  05/01/2022  *Care during the described time interval was provided by me. I have reviewed this patient's available data, including medical history, events of note, physical examination and test results  as part of my evaluation.

## 2022-05-01 NOTE — Progress Notes (Signed)
Pt chest tube placed to water seal per Dr. Bobbye Morton verbal order.

## 2022-05-01 NOTE — Progress Notes (Signed)
Discussed this case with TCTS, plan for water seal and ambulation today, if CXR stable tomorrow, will send home with Heimlich valve.   Jesusita Oka, MD General and [facility]

## 2022-05-02 ENCOUNTER — Inpatient Hospital Stay (HOSPITAL_COMMUNITY): Payer: 59

## 2022-05-02 ENCOUNTER — Other Ambulatory Visit: Payer: Self-pay

## 2022-05-02 DIAGNOSIS — S2242XA Multiple fractures of ribs, left side, initial encounter for closed fracture: Secondary | ICD-10-CM

## 2022-05-02 DIAGNOSIS — K219 Gastro-esophageal reflux disease without esophagitis: Secondary | ICD-10-CM

## 2022-05-02 MED ORDER — LIDOCAINE 5 % EX PTCH
1.0000 | MEDICATED_PATCH | CUTANEOUS | 0 refills | Status: AC
  Filled 2022-05-02: qty 29, 29d supply, fill #0

## 2022-05-02 MED ORDER — IBUPROFEN 800 MG PO TABS
800.0000 mg | ORAL_TABLET | Freq: Three times a day (TID) | ORAL | 0 refills | Status: AC | PRN
  Filled 2022-05-02: qty 30, 10d supply, fill #0

## 2022-05-02 MED ORDER — OXYCODONE HCL 5 MG PO TABS
5.0000 mg | ORAL_TABLET | ORAL | 0 refills | Status: AC | PRN
Start: 2022-05-02 — End: ?
  Filled 2022-05-02: qty 30, 5d supply, fill #0

## 2022-05-02 MED ORDER — METHOCARBAMOL 500 MG PO TABS
1000.0000 mg | ORAL_TABLET | Freq: Three times a day (TID) | ORAL | 0 refills | Status: AC | PRN
Start: 2022-05-02 — End: ?
  Filled 2022-05-02: qty 60, 10d supply, fill #0

## 2022-05-02 NOTE — Progress Notes (Signed)
Pt with orders to d/c home. Assessment and VS documented. PIV removed. Discharge packet and education provided to pt all questions answered. Prescriptions sent to pharmacy pt is aware. Pt and all belongings transported via wheelchair to private vehicle.

## 2022-05-02 NOTE — Progress Notes (Signed)
   Trauma/Critical Care Follow Up Note  Subjective:    Overnight Issues:   Objective:  Vital signs for last 24 hours: Temp:  [97.6 F (36.4 C)-98.5 F (36.9 C)] 97.6 F (36.4 C) (06/29 0500) Pulse Rate:  [62-90] 65 (06/29 0500) Resp:  [16-18] 18 (06/29 0500) BP: (102-133)/(66-85) 102/66 (06/29 0500) SpO2:  [91 %-97 %] 96 % (06/29 0500)  Hemodynamic parameters for last 24 hours:    Intake/Output from previous day: No intake/output data recorded.  Intake/Output this shift: No intake/output data recorded.  Vent settings for last 24 hours:    Physical Exam:  Gen: comfortable, no distress Neuro: non-focal exam HEENT: PERRL Neck: supple CV: RRR Pulm: unlabored breathing Abd: soft, NT GU: clear yellow urine Extr: wwp, no edema   No results found for this or any previous visit (from the past 24 hour(s)).  Assessment & Plan: The plan of care was discussed with the bedside nurse for the day, who is in agreement with this plan and no additional concerns were raised.   Present on Admission:  Closed traumatic fracture of ribs of left side with pneumothorax    LOS: 8 days   Additional comments:I reviewed the patient's new clinical lab test results.   and I reviewed the patients new imaging test results.    61 yo male s/p fall   L rib fractures 7-8 - Multimodal pain control, IS, pulmonary toilet L pneumothorax - CT removed 6/23 due to stable CXR and poorly placed tube. CXR this AM with worsened left apical PTX, but clinically stable on WS. Case d/w TCTS and plan for [facility]    FEN: regular diet, SLIV ID: no current abx VTE: lovenox, SCDs   Dispo: 4NP, home later today if miniexpress can be obtained. F/u 1w with TCTS    Jesusita Oka, MD Trauma & General Surgery Please use [email or web address] to contact on call provider  05/02/2022  *Care during the described time interval was provided by me. I have reviewed this patient's available data, including  medical history, events of note, physical examination and test results as part of my evaluation.

## 2022-05-02 NOTE — Discharge Summary (Signed)
Physician Discharge Summary  Patient ID: Dustin Little MRN: [id number] DOB/AGE: 60/18/1962 60 y.o.  Admit date: 04/24/2022 Discharge date: 05/02/2022  Discharge Diagnoses Fall  Left 7-8 rib fractures Left pneumothorax  Consultants Cardiothoracic surgery   Procedures Chest tube insertion - Dr. Rada Hay (04/24/22) Chest tube insertion - Dr. 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 He fell and struck his left chest on his retractable support device. Initially, had some pain but continued working. He then was evaluated at [facility]. He was found to have a large left pneumothorax and 2 rib fractures. Chest tube was placed and he was accepted in transfer for care on the trauma service.    Hospital Course: Follow up CXR 6/23 with stable PTX despite chest tube being on suction, but also appeared that chest tube may have backed out significantly. Chest tube was placed to water seal and CXR repeated 4 hrs later and was stable. Chest tube removed 6/23. 6/24 CXR showed slightly larger pneumothorax but still <45 mm. Aggressive pulmonary toilet instructed and patient placed on supplemental oxygen, PM CXR showed further expansion of pneumothorax. Left chest tube replaced 6/24. Follow up CXR 6/25 with persistent pneumothorax and suction was increased to -40 cm. Improved pneumothorax noted 6/27 and suction was decreased back to -20 cm. Pneumothorax worsened again 6/28 and suction was increased again. Case discussed with cardiothoracic surgery who recommended trial of water seal and ambulation. CXR was stable 8/31 and Heimlich valve placed per cardiothoracic recommendation and plan for discharge with chest tube in place and follow up with TCTS in 1 week. On 05/02/22 patient stable for discharge with follow up as outlined below.   I or a member of my team have reviewed this patient in the Controlled Substance  Database   Allergies as of 05/02/2022       Reactions   Ambien [zolpidem] Other (See Comments)   Suicidal thoughts   Orphenadrine Citrate Other (See Comments)   Blurred vision    Augmentin [amoxicillin-pot Clavulanate] Rash        Medication List     STOP taking these medications    ciprofloxacin 500 MG tablet Commonly known as: CIPRO       TAKE these medications    acetaminophen 500 MG tablet Commonly known as: TYLENOL Take 1,000 mg by mouth every 6 (six) hours as needed for mild pain or moderate pain.   Adderall XR 15 MG 24 hr capsule Generic drug: amphetamine-dextroamphetamine Take 3 capsules (45 mg total) by mouth every morning for 30 days . What changed:  how much to take when to take this   buPROPion 300 MG 24 hr tablet Commonly known as: WELLBUTRIN XL TAKE 1 TABLET BY MOUTH ONCE DAILY   ibuprofen 800 MG tablet Commonly known as: ADVIL Take 1 tablet (800 mg total) by mouth every 8 (eight) hours as needed for moderate pain or mild pain. What changed:  medication strength when to take this   lidocaine 5 % Commonly known as: LIDODERM Place 1 patch onto the skin daily. Remove & Discard patch within 12 hours or as directed by MD Start taking on: May 03, 2022   methocarbamol 500 MG tablet Commonly known as: ROBAXIN Take 2 tablets (1,000 mg total) by mouth every 8 (eight) hours as needed for muscle spasms.   oxyCODONE 5 MG immediate release tablet Commonly known as: Oxy IR/ROXICODONE Take 1  tablet (5 mg total) by mouth every 4 (four) hours as needed for moderate pain or severe pain.   sildenafil 20 MG tablet Commonly known as: REVATIO TAKE 3-5 TABLETS TWO HOURS PRIOR TO INTERCOURSE What changed:  how much to take how to take this when to take this reasons to take this additional instructions          Follow-up Information     Leonel Ramsay, MD. Call.   Specialty: Infectious Diseases Why: As needed Contact information: [place] [address] [phone]         [place]. Call.   Why: As needed with questions or concerns Contact information: [place] [id number] [phone]        [facility] Follow up in 1 week(s).   Specialty: Cardiothoracic Surgery Why: Follow up with Dr.Lightfoot in 1 week Contact information: [place], [place]                Signed: Norm Parcel , [facility] 05/02/2022, 1:47 PM Please see Amion for pager number during day hours 7:00am-4:30pm

## 2022-05-03 ENCOUNTER — Other Ambulatory Visit: Payer: Self-pay | Admitting: *Deleted

## 2022-05-03 ENCOUNTER — Encounter: Payer: Self-pay | Admitting: *Deleted

## 2022-05-03 NOTE — Patient Outreach (Signed)
Received a hospital discharge referral from Natividad Brood, [place]. I have assigned Joellyn Quails, RN to call for follow up and determine if there are any Case Management needs.    [place] Care, [place], [place] Management [phone]

## 2022-05-03 NOTE — Patient Outreach (Signed)
[facility]) Care Management Telephonic RN Care Manager Note   05/03/2022 Name:  Dustin Little MRN:  [id number] DOB:  [birth year]  Summary: Patient returned a call to RN CM He states he was sleeping earlier He reports he is doing well  With transition of care questions He reports his wife's attempt to get the follow up appointment listed on his discharge sheet with Dr Kipp Brood but the staff in the office did not seem to think he was seen or to be seen by Dr Kipp Brood. Mr Westfall states he does not want to get "lost in the shuffle" especially if he has needs during the holiday weekend. He believes Dr Bobbye Morton will be out of the office  His Chest tube is intact and patent He denies worsening symptoms  He is aware from Dr Bobbye Morton to return back to [facility] for any worsening symptoms He and his wife agrees and gives permission for RN CM to outreach to coordinate this appointment  Pt was given a Thursday May 09 2022 appointment with Dr Kipp Brood  Recommendations/Changes made from today's visit: Successful return outreach from pt Initial assessment Discussed transition of care needs  Assisted with difficulty obtaining cardiac thoracic follow up appointment Spoke wit Delsa Sale at Dr Kipp Brood office [phone] who check and also could not find that the patient was seen by Dr Kipp Brood nor to be followed by him. Discussed with her that the patient was informed that Dr Bobbye Morton had stated Lightfoot would follow pt  Outreach to [facility] [phone] and spoke with Abigail Butts Pt was provided with [facility] nurse call center number, the [facility], cardiac/thoracic office call center information in case worsening concerns during the holiday weekend Encouraged outreach if needed  Subjective: Dustin Little is an 61 y.o. year old male who is a primary patient of Dustin Spurr Cheral Marker, MD. The care management team was consulted for assistance with care management and/or care coordination needs.    Telephonic RN Care  Manager completed Telephone Visit today.   Objective:  Medications Reviewed Today     Reviewed by Barbaraann Faster, RN (Registered Nurse) on 05/03/22 at 1353  Med List Status: <None>   Medication Order Taking? Sig Documenting Provider Last Dose Status Informant  0.9 %  sodium chloride infusion [id number]   Stark Klein, MD  Active   acetaminophen (TYLENOL) 500 MG tablet [id number] No Take 1,000 mg by mouth every 6 (six) hours as needed for mild pain or moderate pain. [provider] 04/24/2022 Active Self, Spouse/Significant Other  acetaminophen (TYLENOL) tablet 650 mg [id number]   Stark Klein, MD  Active   amphetamine-dextroamphetamine (ADDERALL XR) 15 MG 24 hr capsule [id number] No Take 3 capsules (45 mg total) by mouth every morning for 30 days .  Patient taking differently: Take 45 mg by mouth daily.    04/24/2022 Active Self, Spouse/Significant Other, Pharmacy Records  buPROPion (WELLBUTRIN XL) 300 MG 24 hr tablet [id number] No TAKE 1 TABLET BY MOUTH ONCE DAILY  04/24/2022 Active Self, Spouse/Significant Other, Pharmacy Records  docusate sodium (COLACE) capsule 100 mg [id number]   Stark Klein, MD  Active   enoxaparin (LOVENOX) injection 30 mg [id number]   Stark Klein, MD  Active   ibuprofen (ADVIL) 800 MG tablet [id number]  Take 1 tablet (800 mg total) by mouth every 8 (eight) hours as needed for moderate pain or mild pain. Norm Parcel, PA-C  Active   lidocaine (LIDODERM) 5 % [id number]  Place 1 patch onto the  skin daily. Remove & Discard patch within 12 hours or as directed by MD Norm Parcel, PA-C  Active   melatonin tablet 3 mg [id number]   Stark Klein, MD  Active   methocarbamol (ROBAXIN) 500 mg in dextrose 5 % 50 mL IVPB [id number]   Stark Klein, MD  Active   methocarbamol (ROBAXIN) 500 MG tablet [id number]  Take 2 tablets (1,000 mg total) by mouth every 8 (eight) hours as needed for muscle spasms. Norm Parcel, PA-C  Active   methocarbamol (ROBAXIN) tablet 500 mg  [id number]   Stark Klein, MD  Active   morphine (PF) 4 MG/ML injection 1-2 mg [id number]   Stark Klein, MD  Active   ondansetron [facility]) 4 mg in sodium chloride 0.9 % 50 mL IVPB [id number]   Stark Klein, MD  Active   ondansetron (ZOFRAN-ODT) disintegrating tablet 4 mg [id number]   Stark Klein, MD  Active   oxyCODONE (OXY IR/ROXICODONE) 5 MG immediate release tablet [id number]  Take 1 tablet (5 mg total) by mouth every 4 (four) hours as needed for moderate pain or severe pain. Norm Parcel, PA-C  Active   oxyCODONE (Oxy IR/ROXICODONE) immediate release tablet 5-10 mg [id number]   Stark Klein, MD  Active   prochlorperazine (COMPAZINE) injection 5-10 mg [id number]   Stark Klein, MD  Active   prochlorperazine (COMPAZINE) tablet 10 mg [id number]   Stark Klein, MD  Active   sildenafil (REVATIO) 20 MG tablet [id number] No TAKE 3-5 TABLETS TWO HOURS PRIOR TO INTERCOURSE  Patient taking differently: Take 60-100 mg by mouth daily as needed (erectile dysfunction).   Nori Riis, PA-C UNK Active Self, Spouse/Significant Other, Pharmacy Records  sodium chloride flush (NS) 0.9 % injection 3 mL [id number]   Stark Klein, MD  Active   sodium chloride flush (NS) 0.9 % injection 3 mL [id number]   Stark Klein, MD  Active              SDOH:  (Social Determinants of Health) assessments and interventions performed:  SDOH Interventions    Flowsheet Row Most Recent Value  SDOH Interventions   Food Insecurity Interventions Intervention Not Indicated  Financial Strain Interventions Intervention Not Indicated  Housing Interventions Intervention Not Indicated  Stress Interventions Intervention Not Indicated  Transportation Interventions Intervention Not Indicated       Care Plan  Review of patient past medical history, allergies, medications, health status, including review of consultants reports, laboratory and other test data, was performed as part of comprehensive evaluation for care  management services.   Care Plan : RN Care Manager Plan of Care  Updates made by Barbaraann Faster, RN since 05/03/2022 12:00 AM     Problem: Complex Care Coordination Needs and disease management in patient with fall/closed traumatic left ribs with pneumothorax,   Priority: High  Onset Date: 05/03/2022     Long-Range Goal: Establish Plan of Care for Management Complex SDOH Barriers, disease management and Care Coordination Needs in patient with fall/closed traumatic left ribs with pneumothorax   Start Date: 05/03/2022  This Visit's Progress: On track  Priority: High  Note:   Current Barriers:  Care Coordination needs related to post hospital care after fall/Closed traumatic fracture left ribs with pneumothorax  RN CM Clinical Goal(s):  Patient will verbalize understanding of plan for management of ost hospital care after fall/Closed traumatic fracture left ribs with pneumothorax as evidenced by completed follow appointments, home safety reported & with removal of chest tube  through collaboration with  RN Care manager, provider, and care team.   Interventions: Outreaches for care coordination, disease management, resources, home care/education needs Inter-disciplinary care team collaboration (see longitudinal plan of care) Evaluation of current treatment plan related to  self management and patient's adherence to plan as established by provider   Falls Interventions:  (Status:  New goal.) Short Term Goal Advised patient of importance of notifying provider of falls Assessed for falls since last encounter Screening for signs and symptoms of depression related to chronic disease state  Assessed social determinant of health barriers   Interdisciplinary Collaboration Interventions:  (Status: New goal.) Short Term Goal   Collaborated with RN CM to initiate plan of care to address needs related to  cardiac thoracic follow up appointment  in patient with  fall/Closed traumatic fracture left  ribs with pneumothorax 05/03/22 Assisted with difficulty obtaining cardiac thoracic follow up appointment Spoke wit Delsa Sale at Dr Kipp Brood office [phone] who check and also could not find that the patient was seen by Dr Kipp Brood nor to be followed by him. Discussed with her that the patient was informed that Dr Bobbye Morton had stated Kipp Brood would follow pt  Outreach to [facility] [phone] and spoke with Abigail Butts Pt was provided with [facility] nurse call center number, the [facility], cardiac/thoracic office call center information in case worsening concerns during the holiday weekend  Patient Goals/Self-Care Activities: Take all medications as prescribed Attend all scheduled provider appointments Perform all self care activities independently  Call provider office for new concerns or questions   Follow Up Plan:  The patient has been provided with contact information for the care management team and has been advised to call with any health related questions or concerns.  The care management team will reach out to the patient again over the next 30+ business days.       Plan: The patient has been provided with contact information for the care management team and has been advised to call with any health related questions or concerns.   Shantay Sonn L. Lavina Hamman, RN, BSN, [place] Coordinator Office number [phone] [facility] number [phone] Fax number [phone]

## 2022-05-03 NOTE — Patient Outreach (Signed)
[facility]) Care Management  05/03/2022  Dustin Little 07/14/1961 [id number]   [facility] Unsuccessful outreach Outreach attempt to the listed at the preferred outreach number in [place] [phone] listed as his home & mobile number No DPR No answer. Voice mailbox is full        Plan: [facility] RN CM scheduled this patient for another call attempt within 4-7 business days Unsuccessful outreach letter sent on 05/03/22 Unsuccessful outreach on 05/03/22   Joelene Millin L. Lavina Hamman, RN, BSN, [place] Coordinator Office number [phone]

## 2022-05-06 ENCOUNTER — Other Ambulatory Visit: Payer: Self-pay | Admitting: *Deleted

## 2022-05-06 NOTE — Patient Outreach (Signed)
[place] [facility]) Care Management Telephonic RN Care Manager Note   05/06/2022 Name:  Dustin Little MRN:  [id number] DOB:  Oct 14, 1961  Summary: Successful outreach to follow with pt. Wife Millisa answered. She reports Dustin Little had a very good holiday weekend without concerns. He is scheduled to follow up with Dr Kipp Brood on Thursday 05/09/22  No needs identified  Tolerating chest tube Voiced understanding of pending outreach from a [place] RN CM   Recommendations/Changes made from today's visit: Follow outreach  Assessed for care coordination and disease management needs Discussed pending outreach for a [place] RN CM    Subjective: Dustin Little is an 61 y.o. year old male who is a primary patient of Dustin Little, Cheral Marker, MD. The care management team was consulted for assistance with care management and/or care coordination needs.    Telephonic RN Care Manager completed Telephone Visit today.   Objective:  Medications Reviewed Today     Reviewed by Barbaraann Faster, RN (Registered Nurse) on 05/03/22 at 1353  Med List Status: <None>   Medication Order Taking? Sig Documenting Provider Last Dose Status Informant  0.9 %  sodium chloride infusion [id number]   Stark Klein, MD  Active   acetaminophen (TYLENOL) 500 MG tablet [id number] No Take 1,000 mg by mouth every 6 (six) hours as needed for mild pain or moderate pain. [provider] 04/24/2022 Active Self, Spouse/Significant Other  acetaminophen (TYLENOL) tablet 650 mg [id number]   Stark Klein, MD  Active   amphetamine-dextroamphetamine (ADDERALL XR) 15 MG 24 hr capsule [id number] No Take 3 capsules (45 mg total) by mouth every morning for 30 days .  Patient taking differently: Take 45 mg by mouth daily.    04/24/2022 Active Self, Spouse/Significant Other, Pharmacy Records  buPROPion (WELLBUTRIN XL) 300 MG 24 hr tablet [id number] No TAKE 1 TABLET BY MOUTH ONCE DAILY  04/24/2022 Active  Self, Spouse/Significant Other, Pharmacy Records  docusate sodium (COLACE) capsule 100 mg [id number]   Stark Klein, MD  Active   enoxaparin (LOVENOX) injection 30 mg [id number]   Stark Klein, MD  Active   ibuprofen (ADVIL) 800 MG tablet [id number]  Take 1 tablet (800 mg total) by mouth every 8 (eight) hours as needed for moderate pain or mild pain. Norm Parcel, PA-C  Active   lidocaine (LIDODERM) 5 % [id number]  Place 1 patch onto the skin daily. Remove & Discard patch within 12 hours or as directed by MD Norm Parcel, PA-C  Active   melatonin tablet 3 mg [id number]   Stark Klein, MD  Active   methocarbamol (ROBAXIN) 500 mg in dextrose 5 % 50 mL IVPB [id number]   Stark Klein, MD  Active   methocarbamol (ROBAXIN) 500 MG tablet [id number]  Take 2 tablets (1,000 mg total) by mouth every 8 (eight) hours as needed for muscle spasms. Norm Parcel, PA-C  Active   methocarbamol (ROBAXIN) tablet 500 mg [id number]   Stark Klein, MD  Active   morphine (PF) 4 MG/ML injection 1-2 mg [id number]   Stark Klein, MD  Active   ondansetron [facility]) 4 mg in sodium chloride 0.9 % 50 mL IVPB [id number]   Stark Klein, MD  Active   ondansetron (ZOFRAN-ODT) disintegrating tablet 4 mg [id number]   Stark Klein, MD  Active   oxyCODONE (OXY IR/ROXICODONE) 5 MG immediate release tablet [id number]  Take 1 tablet (5 mg total) by mouth every 4 (four) hours as needed for moderate pain  or severe pain. Norm Parcel, PA-C  Active   oxyCODONE (Oxy IR/ROXICODONE) immediate release tablet 5-10 mg [id number]   Stark Klein, MD  Active   prochlorperazine (COMPAZINE) injection 5-10 mg [id number]   Stark Klein, MD  Active   prochlorperazine (COMPAZINE) tablet 10 mg [id number]   Stark Klein, MD  Active   sildenafil (REVATIO) 20 MG tablet [id number] No TAKE 3-5 TABLETS TWO HOURS PRIOR TO INTERCOURSE  Patient taking differently: Take 60-100 mg by mouth daily as needed (erectile dysfunction).   Nori Riis, PA-C UNK  Active Self, Spouse/Significant Other, Pharmacy Records  sodium chloride flush (NS) 0.9 % injection 3 mL [id number]   Stark Klein, MD  Active   sodium chloride flush (NS) 0.9 % injection 3 mL [id number]   Stark Klein, MD  Active              SDOH:  (Social Determinants of Health) assessments and interventions performed:    Care Plan  Review of patient past medical history, allergies, medications, health status, including review of consultants reports, laboratory and other test data, was performed as part of comprehensive evaluation for care management services.   Care Plan : RN Care Manager Plan of Care  Updates made by Barbaraann Faster, RN since 05/06/2022 12:00 AM     Problem: Complex Care Coordination Needs and disease management in patient with fall/closed traumatic left ribs with pneumothorax,   Priority: High  Onset Date: 05/03/2022     Long-Range Goal: Establish Plan of Care for Management Complex SDOH Barriers, disease management and Care Coordination Needs in patient with fall/closed traumatic left ribs with pneumothorax   Start Date: 05/03/2022  Recent Progress: On track  Priority: High  Note:   Current Barriers:  Care Coordination needs related to post hospital care after fall/Closed traumatic fracture left ribs with pneumothorax  RN CM Clinical Goal(s):  Patient will verbalize understanding of plan for management of ost hospital care after fall/Closed traumatic fracture left ribs with pneumothorax as evidenced by completed follow appointments, home safety reported & with removal of chest tube  through collaboration with RN Care manager, provider, and care team.   Interventions: Outreaches for care coordination, disease management, resources, home care/education needs Inter-disciplinary care team collaboration (see longitudinal plan of care) Evaluation of current treatment plan related to  self management and patient's adherence to plan as established by  provider   Falls Interventions:  (Status:  Goal on track:  Yes.) Short Term Goal Advised patient of importance of notifying provider of falls Assessed for falls since last encounter Screening for signs and symptoms of depression related to chronic disease state  Assessed social determinant of health barriers 05/06/22 no falls doing well   Interdisciplinary Collaboration Interventions:  (Status: Goal on track:  Yes.) Short Term Goal   Collaborated with RN CM to initiate plan of care to address needs related to  cardiac thoracic follow up appointment  in patient with  fall/Closed traumatic fracture left ribs with pneumothorax 05/03/22 Assisted with difficulty obtaining cardiac thoracic follow up appointment Spoke wit Delsa Sale at Dr Kipp Brood office [phone] who check and also could not find that the patient was seen by Dr Kipp Brood nor to be followed by him. Discussed with her that the patient was informed that Dr Bobbye Morton had stated Kipp Brood would follow pt  Outreach to [facility] [phone] and spoke with Abigail Butts Pt was provided with [facility] nurse call center number, the [facility], cardiac/thoracic office call center information in  case worsening concerns during the holiday weekend 05/06/22 Doing well Has follow appointment with Dr Kipp Brood on 05/09/22   Patient Goals/Self-Care Activities: Take all medications as prescribed Attend all scheduled provider appointments Perform all self care activities independently  Call provider office for new concerns or questions   Follow Up Plan:  The patient has been provided with contact information for the care management team and has been advised to call with any health related questions or concerns.  The care management team will reach out to the patient again over the next 30+ business days.       Plan: The patient has been provided with contact information for the care management team and has been advised to call with any health related questions or concerns.    [place] L. Lavina Hamman, RN, BSN, [place] Coordinator Office number [phone] Main [facility] number [phone] Fax number [phone]

## 2022-05-08 ENCOUNTER — Other Ambulatory Visit: Payer: Self-pay | Admitting: Thoracic Surgery (Cardiothoracic Vascular Surgery)

## 2022-05-08 DIAGNOSIS — S270XXA Traumatic pneumothorax, initial encounter: Secondary | ICD-10-CM

## 2022-05-08 DIAGNOSIS — S2242XA Multiple fractures of ribs, left side, initial encounter for closed fracture: Secondary | ICD-10-CM

## 2022-05-09 ENCOUNTER — Ambulatory Visit
Admission: RE | Admit: 2022-05-09 | Discharge: 2022-05-09 | Disposition: A | Discharge status: Home / Self Care | Payer: 59 | Source: Ambulatory Visit | Attending: Thoracic Surgery (Cardiothoracic Vascular Surgery) | Admitting: Thoracic Surgery (Cardiothoracic Vascular Surgery)

## 2022-05-09 ENCOUNTER — Other Ambulatory Visit: Payer: Self-pay | Admitting: Thoracic Surgery (Cardiothoracic Vascular Surgery)

## 2022-05-09 ENCOUNTER — Institutional Professional Consult (permissible substitution): Payer: 59 | Admitting: Thoracic Surgery (Cardiothoracic Vascular Surgery)

## 2022-05-09 VITALS — BP 139/85 | HR 66 | Resp 20 | Ht 76.0 in | Wt 185.0 lb

## 2022-05-09 DIAGNOSIS — S2242XA Multiple fractures of ribs, left side, initial encounter for closed fracture: Secondary | ICD-10-CM

## 2022-05-09 DIAGNOSIS — S270XXA Traumatic pneumothorax, initial encounter: Secondary | ICD-10-CM

## 2022-05-09 DIAGNOSIS — Z4682 Encounter for fitting and adjustment of non-vascular catheter: Secondary | ICD-10-CM | POA: Diagnosis not present

## 2022-05-09 DIAGNOSIS — J939 Pneumothorax, unspecified: Secondary | ICD-10-CM | POA: Diagnosis not present

## 2022-05-09 NOTE — Progress Notes (Signed)
      [address]       [place] [address]             [phone]        Semaj L Tennyson [place] Medical Record #[id number] Date of Birth: 1961-03-22  Referring: Leonel Ramsay, MD Primary Care: Leonel Ramsay, MD Primary Cardiologist:None  Reason for visit:   follow-up  History of Present Illness:     61 year old male presents in follow-up.  He is a former trauma patient presenting with left-sided rib fractures and a pneumothorax.  He was discharged with a chest tube and mini express.  He has no shortness of breath or chest pain.  Physical Exam: BP 139/85   Pulse 66   Resp 20   Ht '6\' 4"'$  (1.93 m)   Wt 185 lb (83.9 kg)   SpO2 100%   BMI 22.52 kg/m   Alert NAD Some irritation around the chest tube site.  The chest tube was kinked. Abdomen, ND No peripheral edema   Diagnostic Studies & Laboratory data: CXR: There is no pneumothorax evident on the initial chest x-ray.  After pulling the chest tube out there is no new pneumothorax either.     Assessment / Plan:   61 year old male with traumatic pneumothorax.  Chest tube was removed.  Precautions and warning signs were given.  Patient will follow-up as needed.   Lajuana Matte 05/09/2022 1:34 PM

## 2022-05-13 ENCOUNTER — Other Ambulatory Visit: Payer: Self-pay | Admitting: *Deleted

## 2022-05-13 NOTE — Patient Outreach (Signed)
  Care Coordination [facility] Note Transition Care Management Unsuccessful Follow-up Telephone Call  Date of discharge and from where:  05/02/22 from [facility]  Week # 3  Attempts:  1st Attempt  Reason for unsuccessful TCM follow-up call:  Voice mail full  Fawnda Vitullo L. Lavina Hamman, RN, BSN, [place] Coordinator Office number [phone] Main [facility] number [phone] Fax number [phone]

## 2022-05-14 DIAGNOSIS — F902 Attention-deficit hyperactivity disorder, combined type: Secondary | ICD-10-CM | POA: Diagnosis not present

## 2022-05-20 ENCOUNTER — Other Ambulatory Visit: Payer: Self-pay

## 2022-05-20 MED ORDER — AMPHETAMINE-DEXTROAMPHET ER 15 MG PO CP24
ORAL_CAPSULE | ORAL | 0 refills | Status: AC
Start: 2022-05-20 — End: ?
  Filled 2022-05-20: qty 90, 30d supply, fill #0

## 2022-05-21 ENCOUNTER — Ambulatory Visit: Payer: Self-pay | Admitting: *Deleted

## 2022-05-21 NOTE — Patient Outreach (Signed)
  Care Coordination   Follow Up Visit Note   05/21/2022 Name: Dustin Little MRN: [id number] DOB: 15-Jun-1961  Dustin Little is a 61 y.o. year old male who sees Dustin Ramsay, MD for primary care. I spoke with  Dustin Little by phone today for week 4 of Transition of care   What matters to the patients health and wellness today?  Dustin Little Reports he is doing well today He is "just waiting for my ribs to heal" He reports he I snot working at this time and is staying close to home recovering. He still has his wife for a good support system He denies needs when assessed for SDOH and medical needs   Goals Addressed   None     SDOH assessments and interventions completed:   Yes SDOH Interventions Today    Flowsheet Row Most Recent Value  SDOH Interventions   Food Insecurity Interventions Intervention Not Indicated  Financial Strain Interventions Intervention Not Indicated  Social Connections Interventions Intervention Not Indicated  Transportation Interventions Intervention Not Indicated       Care Coordination Interventions Activated:  No Care Coordination Interventions:   No, not indicated  Follow up plan: No further intervention required.  Encounter Outcome:  Pt. Visit Completed

## 2022-06-21 ENCOUNTER — Other Ambulatory Visit: Payer: Self-pay

## 2022-06-23 ENCOUNTER — Other Ambulatory Visit: Payer: Self-pay

## 2022-06-24 ENCOUNTER — Other Ambulatory Visit: Payer: Self-pay

## 2022-06-24 MED ORDER — AMPHETAMINE-DEXTROAMPHET ER 15 MG PO CP24
ORAL_CAPSULE | ORAL | 0 refills | Status: AC
  Filled 2022-06-24: qty 90, 30d supply, fill #0

## 2022-06-26 ENCOUNTER — Other Ambulatory Visit: Payer: Self-pay

## 2022-07-12 ENCOUNTER — Other Ambulatory Visit: Payer: Self-pay

## 2022-07-30 ENCOUNTER — Other Ambulatory Visit: Payer: Self-pay

## 2022-07-30 MED ORDER — AMPHETAMINE-DEXTROAMPHET ER 15 MG PO CP24
ORAL_CAPSULE | ORAL | 0 refills | Status: AC
  Filled 2022-07-30: qty 90, 30d supply, fill #0

## 2022-08-05 ENCOUNTER — Other Ambulatory Visit (HOSPITAL_BASED_OUTPATIENT_CLINIC_OR_DEPARTMENT_OTHER): Payer: Self-pay

## 2022-08-30 ENCOUNTER — Other Ambulatory Visit: Payer: Self-pay

## 2022-08-30 MED ORDER — AMPHETAMINE-DEXTROAMPHET ER 15 MG PO CP24
ORAL_CAPSULE | ORAL | 0 refills | Status: DC
  Filled 2022-08-30: qty 90, 30d supply, fill #0

## 2022-09-05 ENCOUNTER — Ambulatory Visit: Payer: 59 | Admitting: Dermatology

## 2022-09-13 ENCOUNTER — Ambulatory Visit: Payer: 59 | Admitting: Dermatology

## 2022-09-13 DIAGNOSIS — D229 Melanocytic nevi, unspecified: Secondary | ICD-10-CM | POA: Diagnosis not present

## 2022-09-13 DIAGNOSIS — L819 Disorder of pigmentation, unspecified: Secondary | ICD-10-CM

## 2022-09-13 DIAGNOSIS — L821 Other seborrheic keratosis: Secondary | ICD-10-CM | POA: Diagnosis not present

## 2022-09-13 DIAGNOSIS — L814 Other melanin hyperpigmentation: Secondary | ICD-10-CM

## 2022-09-13 DIAGNOSIS — Z1283 Encounter for screening for malignant neoplasm of skin: Secondary | ICD-10-CM | POA: Diagnosis not present

## 2022-09-13 DIAGNOSIS — L578 Other skin changes due to chronic exposure to nonionizing radiation: Secondary | ICD-10-CM

## 2022-09-13 DIAGNOSIS — N480 Leukoplakia of penis: Secondary | ICD-10-CM

## 2022-09-13 DIAGNOSIS — Z808 Family history of malignant neoplasm of other organs or systems: Secondary | ICD-10-CM

## 2022-09-13 DIAGNOSIS — L820 Inflamed seborrheic keratosis: Secondary | ICD-10-CM

## 2022-09-13 NOTE — Patient Instructions (Addendum)
Cryotherapy Aftercare  Wash gently with soap and water everyday.   Apply Vaseline and Band-Aid daily until healed.     Due to recent changes in healthcare laws, you may see results of your pathology and/or laboratory studies on MyChart before the doctors have had a chance to review them. We understand that in some cases there may be results that are confusing or concerning to you. Please understand that not all results are received at the same time and often the doctors may need to interpret multiple results in order to provide you with the best plan of care or course of treatment. Therefore, we ask that you please give us 2 business days to thoroughly review all your results before contacting the office for clarification. Should we see a critical lab result, you will be contacted sooner.   If You Need Anything After Your Visit  If you have any questions or concerns for your doctor, please call our main line at [phone] and press option 4 to reach your doctor's medical assistant. If no one answers, please leave a voicemail as directed and we will return your call as soon as possible. Messages left after 4 pm will be answered the following business day.   You may also send us a message via MyChart. We typically respond to MyChart messages within 1-2 business days.  For prescription refills, please ask your pharmacy to contact our office. Our fax number is [phone].  If you have an urgent issue when the clinic is closed that cannot wait until the next business day, you can page your doctor at the number below.    Please note that while we do our best to be available for urgent issues outside of office hours, we are not available 24/7.   If you have an urgent issue and are unable to reach us, you may choose to seek medical care at your doctor's office, retail clinic, urgent care center, or emergency room.  If you have a medical emergency, please immediately call 911 or go to the  emergency department.  Pager Numbers  - Dr. Kowalski: [phone]  - Dr. Moye: [phone]  - Dr. Stewart: [phone]  In the event of inclement weather, please call our main line at [phone] for an update on the status of any delays or closures.  Dermatology Medication Tips: Please keep the boxes that topical medications come in in order to help keep track of the instructions about where and how to use these. Pharmacies typically print the medication instructions only on the boxes and not directly on the medication tubes.   If your medication is too expensive, please contact our office at [phone] option 4 or send us a message through MyChart.   We are unable to tell what your co-pay for medications will be in advance as this is different depending on your insurance coverage. However, we may be able to find a substitute medication at lower cost or fill out paperwork to get insurance to cover a needed medication.   If a prior authorization is required to get your medication covered by your insurance company, please allow us 1-2 business days to complete this process.  Drug prices often vary depending on where the prescription is filled and some pharmacies may offer cheaper prices.  The website www.goodrx.com contains coupons for medications through different pharmacies. The prices here do not account for what the cost may be with help from insurance (it may be cheaper with your insurance), but the website can   give you the price if you did not use any insurance.  - You can print the associated coupon and take it with your prescription to the pharmacy.  - You may also stop by our office during regular business hours and pick up a GoodRx coupon card.  - If you need your prescription sent electronically to a different pharmacy, notify our office through [facility] MyChart or by phone at [phone] option 4.     Si Usted Necesita Algo Despus de Su Visita  Tambin puede  enviarnos un mensaje a travs de MyChart. Por lo general respondemos a los mensajes de MyChart en el transcurso de 1 a 2 das hbiles.  Para renovar recetas, por favor pida a su farmacia que se ponga en contacto con nuestra oficina. Nuestro nmero de fax es el [phone].  Si tiene un asunto urgente cuando la clnica est cerrada y que no puede esperar hasta el siguiente da hbil, puede llamar/localizar a su doctor(a) al nmero que aparece a continuacin.   Por favor, tenga en cuenta que aunque hacemos todo lo posible para estar disponibles para asuntos urgentes fuera del horario de oficina, no estamos disponibles las 24 horas del da, los 7 das de la semana.   Si tiene un problema urgente y no puede comunicarse con nosotros, puede optar por buscar atencin mdica  en el consultorio de su doctor(a), en una clnica privada, en un centro de atencin urgente o en una sala de emergencias.  Si tiene una emergencia mdica, por favor llame inmediatamente al 911 o vaya a la sala de emergencias.  Nmeros de bper  - Dr. Kowalski: [phone]  - Dra. Moye: [phone]  - Dra. Stewart: [phone]  En caso de inclemencias del tiempo, por favor llame a nuestra lnea principal al [phone] para una actualizacin sobre el estado de cualquier retraso o cierre.  Consejos para la medicacin en dermatologa: Por favor, guarde las cajas en las que vienen los medicamentos de uso tpico para ayudarle a seguir las instrucciones sobre dnde y cmo usarlos. Las farmacias generalmente imprimen las instrucciones del medicamento slo en las cajas y no directamente en los tubos del medicamento.   Si su medicamento es muy caro, por favor, pngase en contacto con nuestra oficina llamando al [phone] y presione la opcin 4 o envenos un mensaje a travs de MyChart.   No podemos decirle cul ser su copago por los medicamentos por adelantado ya que esto es diferente dependiendo de la cobertura de su seguro.  Sin embargo, es posible que podamos encontrar un medicamento sustituto a menor costo o llenar un formulario para que el seguro cubra el medicamento que se considera necesario.   Si se requiere una autorizacin previa para que su compaa de seguros cubra su medicamento, por favor permtanos de 1 a 2 das hbiles para completar este proceso.  Los precios de los medicamentos varan con frecuencia dependiendo del lugar de dnde se surte la receta y alguna farmacias pueden ofrecer precios ms baratos.  El sitio web www.goodrx.com tiene cupones para medicamentos de diferentes farmacias. Los precios aqu no tienen en cuenta lo que podra costar con la ayuda del seguro (puede ser ms barato con su seguro), pero el sitio web puede darle el precio si no utiliz ningn seguro.  - Puede imprimir el cupn correspondiente y llevarlo con su receta a la farmacia.  - Tambin puede pasar por nuestra oficina durante el horario de atencin regular y recoger una tarjeta de cupones de GoodRx.  -   Si necesita que su receta se enve electrnicamente a una farmacia diferente, informe a nuestra oficina a travs de MyChart de [place] o por telfono llamando al [phone] y presione la opcin 4.  

## 2022-09-13 NOTE — Progress Notes (Signed)
Follow-Up Visit   Subjective  Dustin Little is a 61 y.o. male who presents for the following: Annual Exam (Mole check ). Family hx of Melanoma. The patient presents for Total-Body Skin Exam (TBSE) for skin cancer screening and mole check.  The patient has spots, moles and lesions to be evaluated, some may be new or changing and the patient has concerns that these could be cancer.    The following portions of the chart were reviewed this encounter and updated as appropriate:   Tobacco  Allergies  Meds  Problems  Med Hx  Surg Hx  Fam Hx     Review of Systems:  No other skin or systemic complaints except as noted in HPI or Assessment and Plan.  Objective  Well appearing patient in no apparent distress; mood and affect are within normal limits.  A full examination was performed including scalp, head, eyes, ears, nose, lips, neck, chest, axillae, abdomen, back, buttocks, bilateral upper extremities, bilateral lower extremities, hands, feet, fingers, toes, fingernails, and toenails. All findings within normal limits unless otherwise noted below.  right cheek x 1, left abdomen x 1, left mid back x 1  (3) (3) Stuck-on, waxy, tan-brown papules --Discussed benign etiology and prognosis.    Assessment & Plan  Family history of melanoma - Recommend regular full body skin exams - Recommend daily broad spectrum sunscreen SPF 30+ to sun-exposed areas, reapply every 2 hours as needed.  - Call if any new or changing lesions are noted between office visits   Inflamed seborrheic keratosis (3) right cheek x 1, left abdomen x 1, left mid back x 1  (3) Symptomatic, irritating, patient would like treated.  Destruction of lesion - right cheek x 1, left abdomen x 1, left mid back x 1  (3) Complexity: simple   Destruction method: cryotherapy   Informed consent: discussed and consent obtained   Timeout:  patient name, date of birth, surgical site, and procedure verified Lesion destroyed using  liquid nitrogen: Yes   Region frozen until ice ball extended beyond lesion: Yes   Outcome: patient tolerated procedure well with no complications   Post-procedure details: wound care instructions given    Lentigines - Scattered tan macules - Due to sun exposure - Benign-appearing, observe - Recommend daily broad spectrum sunscreen SPF 30+ to sun-exposed areas, reapply every 2 hours as needed. - Call for any changes  Seborrheic Keratoses - Stuck-on, waxy, tan-brown papules and/or plaques  - Benign-appearing - Discussed benign etiology and prognosis. - Observe - Call for any changes  Melanocytic Nevi - Tan-brown and/or pink-flesh-colored symmetric macules and papules - Benign appearing on exam today - Observation - Call clinic for new or changing moles - Recommend daily use of broad spectrum spf 30+ sunscreen to sun-exposed areas.   Hemangiomas - Red papules - Discussed benign nature - Observe - Call for any changes  Actinic Damage - Chronic condition, secondary to cumulative UV/sun exposure - diffuse scaly erythematous macules with underlying dyspigmentation - Recommend daily broad spectrum sunscreen SPF 30+ to sun-exposed areas, reapply every 2 hours as needed.  - Staying in the shade or wearing long sleeves, sun glasses (UVA+UVB protection) and wide brim hats (4-inch brim around the entire circumference of the hat) are also recommended for sun protection.  - Call for new or changing lesions.  History of Balanitis xerotica obliterans Currently in remission but with postinflammatory dyspigmentation Penis- see photos  Spotty hyperpigmentation appears same today as in previous photos. Benign-appearing.  Observation.  Call clinic for new or changing lesions.  Recommend daily use of broad spectrum spf 30+ sunscreen to sun-exposed areas.    Discussed biopsy if any changes occur - consistent with PIPA from h/o balanitis xerotica obliterans. Will recheck at f/u appt. Pt to RTC  ASAP any changes.   Skin cancer screening performed today.   Return in about 1 year (around 09/14/2023) for TBSE, family hx of Melanoma .  IMarye Round, CMA, am acting as scribe for Sarina Ser, MD .  Documentation: I have reviewed the above documentation for accuracy and completeness, and I agree with the above.  Sarina Ser, MD

## 2022-09-21 ENCOUNTER — Encounter: Payer: Self-pay | Admitting: Dermatology

## 2022-09-28 ENCOUNTER — Ambulatory Visit
Admission: EM | Admit: 2022-09-28 | Discharge: 2022-09-28 | Disposition: A | Discharge status: Home / Self Care | Payer: 59 | Attending: Physician Assistant | Admitting: Physician Assistant

## 2022-09-28 ENCOUNTER — Encounter: Payer: Self-pay | Admitting: Emergency Medicine

## 2022-09-28 DIAGNOSIS — S0501XA Injury of conjunctiva and corneal abrasion without foreign body, right eye, initial encounter: Secondary | ICD-10-CM | POA: Diagnosis not present

## 2022-09-28 MED ORDER — CIPROFLOXACIN HCL 0.3 % OP SOLN: OPHTHALMIC | 0 refills | Status: AC

## 2022-09-28 MED ORDER — KETOROLAC TROMETHAMINE 0.5 % OP SOLN: 1.0000 [drp] | Freq: Four times a day (QID) | OPHTHALMIC | 0 refills | Status: AC

## 2022-09-28 NOTE — Discharge Instructions (Addendum)
-  Corneal abrasion, likely due to your contact lens. -Do not wear contacts back until you are feels normal. - If your pain is not improving in the next couple of days or worsens or you develop discolored eye drainage, change in your vision need to go to the ER or follow-up with eye specialist right away.

## 2022-09-28 NOTE — ED Provider Notes (Signed)
[facility]    CSN: [id number] Arrival date & time: 09/28/22  1511      History   Chief Complaint Chief Complaint  Patient presents with   Eye Problem    right    HPI Dustin Little is a 61 y.o. male presenting for redness and tearing of the right eye upon waking up this morning.  Patient says he sleeps his contact and but actually did not sleep with it on last night.  He reports a foreign body sensation at this time.  He reports photophobia as well.  He has not had any severe pain.  No change to his vision.  He is not currently wearing his contact.  He has not had any discolored drainage from the eye or eye swelling.  No headaches, dizziness, vomiting, fever.  No recent illness.  HPI  Past Medical History:  Diagnosis Date   Anxiety    BPH (benign prostatic hypertrophy)    BXO (balanitis xerotica obliterans)    Depression    ED (erectile dysfunction)    Heartburn    History of kidney stones    Hypogonadism in male     Patient Active Problem List   Diagnosis Date Noted   Closed traumatic fracture of ribs of left side with pneumothorax 04/24/2022   ADHD (attention deficit hyperactivity disorder) 04/19/2018   Depression, major, single episode, complete remission ([place]) 05/10/2016   Gastroesophageal reflux disease without esophagitis 05/10/2016   H/O attention deficit hyperactivity disorder 05/10/2016   BPH with obstruction/lower urinary tract symptoms 03/05/2016   Erectile dysfunction 03/05/2016   Corneal scar, left eye 03/26/2013   Keratitis 04/29/2012    Past Surgical History:  Procedure Laterality Date   CHOLECYSTECTOMY     CORNEAL TRANSPLANT  02/04/2013   HERNIA REPAIR         Home Medications    Prior to Admission medications   Medication Sig Start Date End Date Taking? Authorizing Provider  amphetamine-dextroamphetamine (ADDERALL XR) 15 MG 24 hr capsule Take 3 capsules (45 mg total) by mouth every morning for 30 days . 05/20/22  Yes    buPROPion (WELLBUTRIN XL) 300 MG 24 hr tablet TAKE 1 TABLET BY MOUTH ONCE DAILY 04/04/22  Yes   ciprofloxacin (CILOXAN) 0.3 % ophthalmic solution Administer 1 drop, every 2 hours, while awake, for 2 days. Then 1 drop, every 4 hours, while awake, for the next 5 days. 09/28/22  Yes Laurene Footman B, PA-C  ketorolac (ACULAR) 0.5 % ophthalmic solution Place 1 drop into both eyes every 6 (six) hours. 09/28/22  Yes Danton Clap, PA-C  acetaminophen (TYLENOL) 500 MG tablet Take 1,000 mg by mouth every 6 (six) hours as needed for mild pain or moderate pain.    [provider]  amphetamine-dextroamphetamine (ADDERALL XR) 15 MG 24 hr capsule Take 3 capsules (45 mg total) by mouth every morning for 30 days . Patient taking differently: Take 45 mg by mouth daily. 03/08/22     amphetamine-dextroamphetamine (ADDERALL XR) 15 MG 24 hr capsule Take 3 capsules (45 mg total) by mouth every morning for 30 days . 06/24/22     amphetamine-dextroamphetamine (ADDERALL XR) 15 MG 24 hr capsule Take 3 capsules (45 mg total) by mouth every morning 07/30/22     amphetamine-dextroamphetamine (ADDERALL XR) 15 MG 24 hr capsule Take 3 capsules (45 mg total) by mouth every morning . 08/30/22     ibuprofen (ADVIL) 800 MG tablet Take 1 tablet (800 mg total) by mouth every  8 (eight) hours as needed for moderate pain or mild pain. 05/02/22   Norm Parcel, PA-C  lidocaine (LIDODERM) 5 % Place 1 patch onto the skin daily. Remove & Discard patch within 12 hours or as directed by MD 05/03/22   Norm Parcel, PA-C  methocarbamol (ROBAXIN) 500 MG tablet Take 2 tablets (1,000 mg total) by mouth every 8 (eight) hours as needed for muscle spasms. 05/02/22   Norm Parcel, PA-C  oxyCODONE (OXY IR/ROXICODONE) 5 MG immediate release tablet Take 1 tablet (5 mg total) by mouth every 4 (four) hours as needed for moderate pain or severe pain. 05/02/22   Norm Parcel, PA-C  sildenafil (REVATIO) 20 MG tablet TAKE 3-5 TABLETS TWO HOURS PRIOR  TO INTERCOURSE Patient taking differently: Take 60-100 mg by mouth daily as needed (erectile dysfunction). 01/17/20   Nori Riis, PA-C    Family History Family History  Problem Relation Age of Onset   Cancer Unknown    Kidney disease Neg Hx    Prostate cancer Neg Hx     Social History Social History   Tobacco Use   Smoking status: Never   Smokeless tobacco: Never  Vaping Use   Vaping Use: Never used  Substance Use Topics   Alcohol use: Yes    Alcohol/week: 0.0 standard drinks of alcohol    Comment: occ   Drug use: No     Allergies   Ambien [zolpidem], Orphenadrine citrate, and Augmentin [amoxicillin-pot clavulanate]   Review of Systems Review of Systems  Constitutional:  Negative for fatigue.  HENT:  Negative for congestion.   Eyes:  Positive for photophobia, pain, discharge (clear drainage) and redness. Negative for itching and visual disturbance.  Respiratory:  Negative for cough.   Neurological:  Negative for dizziness, weakness and headaches.     Physical Exam Triage Vital Signs ED Triage Vitals  Enc Vitals Group     BP 09/28/22 1530 131/80     Pulse Rate 09/28/22 1530 69     Resp 09/28/22 1530 15     Temp 09/28/22 1530 98.5 F (36.9 C)     Temp Source 09/28/22 1530 Oral     SpO2 09/28/22 1530 98 %     Weight 09/28/22 1529 184 lb 15.5 oz (83.9 kg)     Height 09/28/22 1529 '6\' 4"'$  (1.93 m)     Head Circumference --      Peak Flow --      Pain Score 09/28/22 1528 2     Pain Loc --      Pain Edu? --      Excl. in [place]? --    No data found.  Updated Vital Signs BP 131/80 (BP Location: Right Arm)   Pulse 69   Temp 98.5 F (36.9 C) (Oral)   Resp 15   Ht '6\' 4"'$  (1.93 m)   Wt 184 lb 15.5 oz (83.9 kg)   SpO2 98%   BMI 22.51 kg/m   Visual Acuity Right Eye Distance: 20/100 uncorrected Left Eye Distance: 20/30 corrected Bilateral Distance: 20/30  Physical Exam Vitals and nursing note reviewed.  Constitutional:      General: He is not in  acute distress.    Appearance: Normal appearance. He is well-developed. He is not ill-appearing.  HENT:     Head: Normocephalic and atraumatic.     Nose: Nose normal.     Mouth/Throat:     Mouth: Mucous membranes are moist.     Pharynx: Oropharynx  is clear.  Eyes:     General: Lids are normal. Lids are everted, no foreign bodies appreciated. No scleral icterus.       Right eye: No foreign body, discharge or hordeolum.     Extraocular Movements: Extraocular movements intact.     Conjunctiva/sclera:     Right eye: Right conjunctiva is injected (mildly).     Pupils: Pupils are equal, round, and reactive to light.     Right eye: Corneal abrasion present.     Slit lamp exam:    Right eye: Photophobia present.   Cardiovascular:     Rate and Rhythm: Normal rate and regular rhythm.     Heart sounds: Normal heart sounds.  Pulmonary:     Effort: Pulmonary effort is normal. No respiratory distress.     Breath sounds: Normal breath sounds.  Musculoskeletal:     Cervical back: Neck supple.  Skin:    General: Skin is warm and dry.     Capillary Refill: Capillary refill takes less than 2 seconds.  Neurological:     General: No focal deficit present.     Mental Status: He is alert. Mental status is at baseline.     Motor: No weakness.     Gait: Gait normal.  Psychiatric:        Mood and Affect: Mood normal.        Behavior: Behavior normal.      UC Treatments / Results  Labs (all labs ordered are listed, but only abnormal results are displayed) Labs Reviewed - No data to display  EKG   Radiology No results found.  Procedures Procedures (including critical care time)  Medications Ordered in UC Medications - No data to display  Initial Impression / Assessment and Plan / UC Course  I have reviewed the triage vital signs and the nursing notes.  Pertinent labs & imaging results that were available during my care of the patient were reviewed by me and considered in my medical  decision making (see chart for details).   61 year old male presents for redness and tearing of the right eye as well as photophobia since waking up this morning.  Reports use of contact lenses.  Patient reports foreign body sensation.  Denies any change in vision.  Is not wearing his contact lens so the vision is a little worse in the right eye because of this.  He has not had any headaches, dizziness, vomiting.  On evaluation he has corneal abrasion, photophobia, clear watery drainage, mild conjunctival injection.  No foreign bodies noted.  Will treat at this time with Ciloxan and ketorolac.  Advised to follow-up with eye specialist.  ED precautions given.   Final Clinical Impressions(s) / UC Diagnoses   Final diagnoses:  Abrasion of right cornea, initial encounter     Discharge Instructions      -Corneal abrasion, likely due to your contact lens. -Do not wear contacts back until you are feels normal. - If your pain is not improving in the next couple of days or worsens or you develop discolored eye drainage, change in your vision need to go to the ER or follow-up with eye specialist right away.    ED Prescriptions     Medication Sig Dispense Auth. Provider   ciprofloxacin (CILOXAN) 0.3 % ophthalmic solution Administer 1 drop, every 2 hours, while awake, for 2 days. Then 1 drop, every 4 hours, while awake, for the next 5 days. 5 mL Laurene Footman B, PA-C   ketorolac (  ACULAR) 0.5 % ophthalmic solution Place 1 drop into both eyes every 6 (six) hours. 5 mL Danton Clap, PA-C      PDMP not reviewed this encounter.   Danton Clap, PA-C 09/28/22 1550

## 2022-09-28 NOTE — ED Triage Notes (Signed)
Patient c/o right eye drainage and redness that started this morning.  Patient denies fevers.

## 2022-09-30 ENCOUNTER — Other Ambulatory Visit: Payer: Self-pay

## 2022-10-01 ENCOUNTER — Other Ambulatory Visit: Payer: Self-pay

## 2022-10-01 MED ORDER — AMPHETAMINE-DEXTROAMPHET ER 15 MG PO CP24
ORAL_CAPSULE | ORAL | 0 refills | Status: AC
  Filled 2022-10-01: qty 90, 30d supply, fill #0

## 2022-10-10 ENCOUNTER — Other Ambulatory Visit: Payer: Self-pay

## 2022-10-10 DIAGNOSIS — F325 Major depressive disorder, single episode, in full remission: Secondary | ICD-10-CM | POA: Diagnosis not present

## 2022-10-10 DIAGNOSIS — E349 Endocrine disorder, unspecified: Secondary | ICD-10-CM | POA: Diagnosis not present

## 2022-10-10 DIAGNOSIS — F909 Attention-deficit hyperactivity disorder, unspecified type: Secondary | ICD-10-CM | POA: Diagnosis not present

## 2022-10-10 DIAGNOSIS — N401 Enlarged prostate with lower urinary tract symptoms: Secondary | ICD-10-CM | POA: Diagnosis not present

## 2022-10-10 DIAGNOSIS — Z0000 Encounter for general adult medical examination without abnormal findings: Secondary | ICD-10-CM | POA: Diagnosis not present

## 2022-10-10 DIAGNOSIS — N138 Other obstructive and reflux uropathy: Secondary | ICD-10-CM | POA: Diagnosis not present

## 2022-10-10 DIAGNOSIS — K219 Gastro-esophageal reflux disease without esophagitis: Secondary | ICD-10-CM | POA: Diagnosis not present

## 2022-10-10 MED ORDER — AMPHETAMINE-DEXTROAMPHET ER 15 MG PO CP24
45.0000 mg | ORAL_CAPSULE | Freq: Every day | ORAL | 0 refills | Status: AC
  Filled 2022-11-01: qty 90, 30d supply, fill #0

## 2022-10-10 MED ORDER — AMPHETAMINE-DEXTROAMPHET ER 15 MG PO CP24
ORAL_CAPSULE | ORAL | 0 refills | Status: DC
  Filled 2023-01-01: qty 90, 30d supply, fill #0

## 2022-10-10 MED ORDER — AMPHETAMINE-DEXTROAMPHET ER 15 MG PO CP24
ORAL_CAPSULE | ORAL | 0 refills | Status: AC
  Filled 2022-12-03: qty 90, 30d supply, fill #0

## 2022-11-01 ENCOUNTER — Other Ambulatory Visit: Payer: Self-pay

## 2022-11-29 ENCOUNTER — Other Ambulatory Visit: Payer: Self-pay

## 2022-12-03 ENCOUNTER — Other Ambulatory Visit: Payer: Self-pay

## 2023-01-01 ENCOUNTER — Other Ambulatory Visit: Payer: Self-pay

## 2023-01-02 ENCOUNTER — Other Ambulatory Visit: Payer: Self-pay

## 2023-01-29 ENCOUNTER — Other Ambulatory Visit: Payer: Self-pay

## 2023-01-30 ENCOUNTER — Other Ambulatory Visit: Payer: Self-pay

## 2023-01-30 MED ORDER — AMPHETAMINE-DEXTROAMPHET ER 15 MG PO CP24
45.0000 mg | ORAL_CAPSULE | Freq: Every day | ORAL | 0 refills | Status: DC
  Filled 2023-01-30: qty 90, 30d supply, fill #0

## 2023-01-31 ENCOUNTER — Other Ambulatory Visit (HOSPITAL_COMMUNITY): Payer: Self-pay

## 2023-02-24 DIAGNOSIS — S0502XA Injury of conjunctiva and corneal abrasion without foreign body, left eye, initial encounter: Secondary | ICD-10-CM | POA: Diagnosis not present

## 2023-02-26 ENCOUNTER — Other Ambulatory Visit: Payer: Self-pay

## 2023-02-26 DIAGNOSIS — S0502XD Injury of conjunctiva and corneal abrasion without foreign body, left eye, subsequent encounter: Secondary | ICD-10-CM | POA: Diagnosis not present

## 2023-02-26 MED ORDER — AMPHETAMINE-DEXTROAMPHET ER 15 MG PO CP24
45.0000 mg | ORAL_CAPSULE | Freq: Every day | ORAL | 0 refills | Status: DC
  Filled 2023-02-27: qty 90, 30d supply, fill #0

## 2023-02-27 ENCOUNTER — Other Ambulatory Visit: Payer: Self-pay

## 2023-02-28 ENCOUNTER — Other Ambulatory Visit: Payer: Self-pay

## 2023-02-28 DIAGNOSIS — S0502XD Injury of conjunctiva and corneal abrasion without foreign body, left eye, subsequent encounter: Secondary | ICD-10-CM | POA: Diagnosis not present

## 2023-02-28 DIAGNOSIS — H18892 Other specified disorders of cornea, left eye: Secondary | ICD-10-CM | POA: Diagnosis not present

## 2023-02-28 MED ORDER — PREDNISOLONE ACETATE 1 % OP SUSP
1.0000 [drp] | Freq: Two times a day (BID) | OPHTHALMIC | 0 refills | Status: AC
  Filled 2023-02-28: qty 5, 50d supply, fill #0

## 2023-03-03 DIAGNOSIS — S0502XD Injury of conjunctiva and corneal abrasion without foreign body, left eye, subsequent encounter: Secondary | ICD-10-CM | POA: Diagnosis not present

## 2023-03-07 DIAGNOSIS — S0502XD Injury of conjunctiva and corneal abrasion without foreign body, left eye, subsequent encounter: Secondary | ICD-10-CM | POA: Diagnosis not present

## 2023-04-01 ENCOUNTER — Other Ambulatory Visit: Payer: Self-pay

## 2023-04-01 MED ORDER — AMPHETAMINE-DEXTROAMPHET ER 15 MG PO CP24
45.0000 mg | ORAL_CAPSULE | Freq: Every day | ORAL | 0 refills | Status: DC
  Filled 2023-04-01 – 2023-04-08 (×2): qty 90, 30d supply, fill #0

## 2023-04-01 MED ORDER — AMPHETAMINE-DEXTROAMPHET ER 15 MG PO CP24
45.0000 mg | ORAL_CAPSULE | Freq: Every day | ORAL | 0 refills | Status: DC
  Filled 2023-04-01 – 2023-09-05 (×2): qty 90, 30d supply, fill #0

## 2023-04-03 ENCOUNTER — Other Ambulatory Visit: Payer: Self-pay

## 2023-04-04 ENCOUNTER — Other Ambulatory Visit: Payer: Self-pay

## 2023-04-07 ENCOUNTER — Other Ambulatory Visit: Payer: Self-pay

## 2023-04-08 ENCOUNTER — Other Ambulatory Visit: Payer: Self-pay

## 2023-05-05 ENCOUNTER — Other Ambulatory Visit: Payer: Self-pay

## 2023-05-05 MED ORDER — BUPROPION HCL ER (XL) 300 MG PO TB24
300.0000 mg | ORAL_TABLET | Freq: Every day | ORAL | 3 refills | Status: DC
  Filled 2023-05-05: qty 90, 90d supply, fill #0
  Filled 2023-07-31: qty 90, 90d supply, fill #1
  Filled 2023-11-04: qty 90, 90d supply, fill #2
  Filled 2024-02-10: qty 90, 90d supply, fill #3

## 2023-05-05 MED ORDER — AMPHETAMINE-DEXTROAMPHET ER 15 MG PO CP24
45.0000 mg | ORAL_CAPSULE | Freq: Every day | ORAL | 0 refills | Status: DC
  Filled 2023-05-05: qty 90, 30d supply, fill #0

## 2023-05-29 ENCOUNTER — Other Ambulatory Visit: Payer: Self-pay

## 2023-05-29 DIAGNOSIS — Z0000 Encounter for general adult medical examination without abnormal findings: Secondary | ICD-10-CM | POA: Diagnosis not present

## 2023-05-29 DIAGNOSIS — R7303 Prediabetes: Secondary | ICD-10-CM | POA: Diagnosis not present

## 2023-05-29 DIAGNOSIS — K219 Gastro-esophageal reflux disease without esophagitis: Secondary | ICD-10-CM | POA: Diagnosis not present

## 2023-05-29 DIAGNOSIS — F325 Major depressive disorder, single episode, in full remission: Secondary | ICD-10-CM | POA: Diagnosis not present

## 2023-05-29 DIAGNOSIS — F909 Attention-deficit hyperactivity disorder, unspecified type: Secondary | ICD-10-CM | POA: Diagnosis not present

## 2023-05-29 DIAGNOSIS — R972 Elevated prostate specific antigen [PSA]: Secondary | ICD-10-CM | POA: Diagnosis not present

## 2023-05-29 MED ORDER — AMPHETAMINE-DEXTROAMPHET ER 15 MG PO CP24
45.0000 mg | ORAL_CAPSULE | Freq: Every day | ORAL | 0 refills | Status: AC
  Filled 2023-08-06 – 2023-08-08 (×2): qty 90, 30d supply, fill #0

## 2023-05-29 MED ORDER — AMPHETAMINE-DEXTROAMPHET ER 15 MG PO CP24: 15.0000 mg | ORAL_CAPSULE | Freq: Every day | ORAL | 0 refills | Status: DC

## 2023-05-29 MED ORDER — AMPHETAMINE-DEXTROAMPHET ER 15 MG PO CP24
45.0000 mg | ORAL_CAPSULE | Freq: Every day | ORAL | 0 refills | Status: DC
  Filled 2023-06-02: qty 90, 30d supply, fill #0

## 2023-05-29 MED ORDER — AMPHETAMINE-DEXTROAMPHET ER 15 MG PO CP24
45.0000 mg | ORAL_CAPSULE | Freq: Every day | ORAL | 0 refills | Status: DC
  Filled 2023-07-08: qty 90, 30d supply, fill #0

## 2023-06-02 ENCOUNTER — Other Ambulatory Visit: Payer: Self-pay

## 2023-06-09 ENCOUNTER — Other Ambulatory Visit: Payer: Self-pay

## 2023-06-09 MED ORDER — PAXLOVID (300/100) 20 X 150 MG & 10 X 100MG PO TBPK
ORAL_TABLET | ORAL | 0 refills | Status: AC
  Filled 2023-06-09: qty 30, 5d supply, fill #0

## 2023-06-21 ENCOUNTER — Telehealth: Payer: Commercial Managed Care - PPO | Admitting: Nurse Practitioner

## 2023-06-21 DIAGNOSIS — J0100 Acute maxillary sinusitis, unspecified: Secondary | ICD-10-CM | POA: Diagnosis not present

## 2023-06-21 MED ORDER — DOXYCYCLINE HYCLATE 100 MG PO TABS: 100.0000 mg | ORAL_TABLET | Freq: Two times a day (BID) | ORAL | 0 refills | Status: DC

## 2023-06-21 NOTE — Progress Notes (Signed)
E-Visit for Sinus Problems  We are sorry that you are not feeling well.  Here is how we plan to help!  Based on what you have shared with me it looks like you have sinusitis.  Sinusitis is inflammation and infection in the sinus cavities of the head.  Based on your presentation I believe you most likely have Acute Bacterial Sinusitis.  This is an infection caused by bacteria and is treated with antibiotics. I have prescribed Doxycycline 100mg by mouth twice a day for 10 days. You may use an oral decongestant such as Mucinex D or if you have glaucoma or high blood pressure use plain Mucinex. Saline nasal spray help and can safely be used as often as needed for congestion.  If you develop worsening sinus pain, fever or notice severe headache and vision changes, or if symptoms are not better after completion of antibiotic, please schedule an appointment with a health care provider.    Sinus infections are not as easily transmitted as other respiratory infection, however we still recommend that you avoid close contact with loved ones, especially the very young and elderly.  Remember to wash your hands thoroughly throughout the day as this is the number one way to prevent the spread of infection!  Home Care: Only take medications as instructed by your medical team. Complete the entire course of an antibiotic. Do not take these medications with alcohol. A steam or ultrasonic humidifier can help congestion.  You can place a towel over your head and breathe in the steam from hot water coming from a faucet. Avoid close contacts especially the very young and the elderly. Cover your mouth when you cough or sneeze. Always remember to wash your hands.  Get Help Right Away If: You develop worsening fever or sinus pain. You develop a severe head ache or visual changes. Your symptoms persist after you have completed your treatment plan.  Make sure you Understand these instructions. Will watch your  condition. Will get help right away if you are not doing well or get worse.  Thank you for choosing an e-visit.  Your e-visit answers were reviewed by a board certified advanced clinical practitioner to complete your personal care plan. Depending upon the condition, your plan could have included both over the counter or prescription medications.  Please review your pharmacy choice. Make sure the pharmacy is open so you can pick up prescription now. If there is a problem, you may contact your provider through MyChart messaging and have the prescription routed to another pharmacy.  Your safety is important to us. If you have drug allergies check your prescription carefully.   For the next 24 hours you can use MyChart to ask questions about today's visit, request a non-urgent call back, or ask for a work or school excuse. You will get an email in the next two days asking about your experience. I hope that your e-visit has been valuable and will speed your recovery.  Mary-Margaret Martin, FNP   5-10 minutes spent reviewing and documenting in chart.  

## 2023-07-03 ENCOUNTER — Other Ambulatory Visit: Payer: Self-pay

## 2023-07-08 ENCOUNTER — Other Ambulatory Visit: Payer: Self-pay

## 2023-08-06 ENCOUNTER — Other Ambulatory Visit: Payer: Self-pay

## 2023-08-08 ENCOUNTER — Other Ambulatory Visit: Payer: Self-pay

## 2023-09-03 ENCOUNTER — Other Ambulatory Visit: Payer: Self-pay

## 2023-09-05 ENCOUNTER — Other Ambulatory Visit: Payer: Self-pay

## 2023-09-15 ENCOUNTER — Other Ambulatory Visit: Payer: Self-pay

## 2023-09-15 DIAGNOSIS — Z860101 Personal history of adenomatous and serrated colon polyps: Secondary | ICD-10-CM | POA: Diagnosis not present

## 2023-09-15 DIAGNOSIS — Z01818 Encounter for other preprocedural examination: Secondary | ICD-10-CM | POA: Diagnosis not present

## 2023-09-15 MED ORDER — NA SULFATE-K SULFATE-MG SULF 17.5-3.13-1.6 GM/177ML PO SOLN
ORAL | 0 refills | Status: DC
  Filled 2023-09-15: qty 354, 1d supply, fill #0

## 2023-09-18 ENCOUNTER — Ambulatory Visit: Payer: Commercial Managed Care - PPO | Admitting: Dermatology

## 2023-09-18 ENCOUNTER — Encounter: Payer: Self-pay | Admitting: Dermatology

## 2023-09-18 DIAGNOSIS — L578 Other skin changes due to chronic exposure to nonionizing radiation: Secondary | ICD-10-CM

## 2023-09-18 DIAGNOSIS — Z808 Family history of malignant neoplasm of other organs or systems: Secondary | ICD-10-CM

## 2023-09-18 DIAGNOSIS — D229 Melanocytic nevi, unspecified: Secondary | ICD-10-CM

## 2023-09-18 DIAGNOSIS — T148XXA Other injury of unspecified body region, initial encounter: Secondary | ICD-10-CM

## 2023-09-18 DIAGNOSIS — S0001XA Abrasion of scalp, initial encounter: Secondary | ICD-10-CM

## 2023-09-18 DIAGNOSIS — L814 Other melanin hyperpigmentation: Secondary | ICD-10-CM

## 2023-09-18 DIAGNOSIS — Z1283 Encounter for screening for malignant neoplasm of skin: Secondary | ICD-10-CM | POA: Diagnosis not present

## 2023-09-18 NOTE — Patient Instructions (Signed)

## 2023-09-18 NOTE — Progress Notes (Signed)
   Follow-Up Visit   Subjective  Dustin Little is a 62 y.o. male who presents for the following: Skin Cancer Screening and Full Body Skin Exam  The patient presents for Total-Body Skin Exam (TBSE) for skin cancer screening and mole check. The patient has spots, moles and lesions to be evaluated, some may be new or changing and the patient may have concern these could be cancer.  Fhx skin cancer.  The following portions of the chart were reviewed this encounter and updated as appropriate: medications, allergies, medical history  Review of Systems:  No other skin or systemic complaints except as noted in HPI or Assessment and Plan.  Objective  Well appearing patient in no apparent distress; mood and affect are within normal limits.  A full examination was performed including scalp, head, eyes, ears, nose, lips, neck, chest, axillae, abdomen, back, buttocks, bilateral upper extremities, bilateral lower extremities, hands, feet, fingers, toes, fingernails, and toenails. All findings within normal limits unless otherwise noted below.   Relevant physical exam findings are noted in the Assessment and Plan.  Scalp Scabs     Assessment & Plan   SKIN CANCER SCREENING PERFORMED TODAY.  ACTINIC DAMAGE - Chronic condition, secondary to cumulative UV/sun exposure - diffuse scaly erythematous macules with underlying dyspigmentation - Recommend daily broad spectrum sunscreen SPF 30+ to sun-exposed areas, reapply every 2 hours as needed.  - Staying in the shade or wearing long sleeves, sun glasses (UVA+UVB protection) and wide brim hats (4-inch brim around the entire circumference of the hat) are also recommended for sun protection.  - Call for new or changing lesions.  LENTIGINES, SEBORRHEIC KERATOSES, HEMANGIOMAS - Benign normal skin lesions - Benign-appearing - Call for any changes  MELANOCYTIC NEVI - Tan-brown and/or pink-flesh-colored symmetric macules and papules - Benign appearing on  exam today - Observation - Call clinic for new or changing moles - Recommend daily use of broad spectrum spf 30+ sunscreen to sun-exposed areas.   Family history of melanoma - patient's sister passed away at 50 years old - Recommend regular full body skin exams - Recommend daily broad spectrum sunscreen SPF 30+ to sun-exposed areas, reapply every 2 hours as needed.  - Call if any new or changing lesions are noted between office visits     Excoriation Scalp  2ndary to injury  Patient to RTC by 6 weeks if not healing. Patient's wife will check scalp to make sure areas are healing.    Return in about 1 year (around 09/17/2024) for TBSE, with Dr. Kirtland Bouchard, Fhx MM, Hx AK.  Anise Salvo, RMA, am acting as scribe for Armida Sans, MD .   Documentation: I have reviewed the above documentation for accuracy and completeness, and I agree with the above.  Armida Sans, MD

## 2023-09-19 ENCOUNTER — Other Ambulatory Visit: Payer: Self-pay

## 2023-10-03 ENCOUNTER — Other Ambulatory Visit: Payer: Self-pay

## 2023-10-05 ENCOUNTER — Other Ambulatory Visit: Payer: Self-pay

## 2023-10-06 ENCOUNTER — Other Ambulatory Visit: Payer: Self-pay

## 2023-10-06 MED ORDER — AMPHETAMINE-DEXTROAMPHET ER 15 MG PO CP24
45.0000 mg | ORAL_CAPSULE | Freq: Every day | ORAL | 0 refills | Status: DC
  Filled 2023-10-06: qty 90, 30d supply, fill #0

## 2023-10-10 NOTE — Progress Notes (Unsigned)
10/14/2023 10:37 AM   Dannielle Karvonen 11/01/1961 [id number]  Referring provider: Mick Sell, MD [address] [place],  [place] [address]  Urological history: 1.  Erectile dysfunction -contributing factors of age, BPH, depression, hyperlipidemia,     No chief complaint on file.   HPI: Dustin Little is a 62 y.o. *** who presents today for ****  Previous records reviewed.   PMH: Past Medical History:  Diagnosis Date   Anxiety    BPH (benign prostatic hypertrophy)    BXO (balanitis xerotica obliterans)    Depression    ED (erectile dysfunction)    Heartburn    History of kidney stones    Hypogonadism in male     Surgical History: Past Surgical History:  Procedure Laterality Date   CHOLECYSTECTOMY     CORNEAL TRANSPLANT  02/04/2013   HERNIA REPAIR      Home Medications:  Allergies as of 10/14/2023       Reactions   Ambien [zolpidem] Other (See Comments)   Suicidal thoughts   Orphenadrine Citrate Other (See Comments)   Blurred vision    Augmentin [amoxicillin-pot Clavulanate] Rash        Medication List        Accurate as of October 10, 2023 10:37 AM. If you have any questions, ask your nurse or doctor.          acetaminophen 500 MG tablet Commonly known as: TYLENOL Take 1,000 mg by mouth every 6 (six) hours as needed for mild pain or moderate pain.   Adderall XR 15 MG 24 hr capsule Generic drug: amphetamine-dextroamphetamine Take 3 capsules (45 mg total) by mouth every morning for 30 days . What changed:  how much to take when to take this   Adderall XR 15 MG 24 hr capsule Generic drug: amphetamine-dextroamphetamine Take 3 capsules (45 mg total) by mouth every morning for 30 days . What changed: Another medication with the same name was changed. Make sure you understand how and when to take each.   Adderall XR 15 MG 24 hr capsule Generic drug: amphetamine-dextroamphetamine Take 3 capsules (45 mg total) by mouth every  morning for 30 days . What changed: Another medication with the same name was changed. Make sure you understand how and when to take each.   Adderall XR 15 MG 24 hr capsule Generic drug: amphetamine-dextroamphetamine Take 3 capsules (45 mg total) by mouth every morning What changed: Another medication with the same name was changed. Make sure you understand how and when to take each.   Adderall XR 15 MG 24 hr capsule Generic drug: amphetamine-dextroamphetamine Take 3 capsules (45 mg total) by mouth every morning . What changed: Another medication with the same name was changed. Make sure you understand how and when to take each.   Adderall XR 15 MG 24 hr capsule Generic drug: amphetamine-dextroamphetamine Take 3 capsules by mouth daily. What changed: Another medication with the same name was changed. Make sure you understand how and when to take each.   amphetamine-dextroamphetamine 15 MG 24 hr capsule Commonly known as: ADDERALL XR Take 3 capsules (45 mg total) by mouth once daily for 30 days . What changed: Another medication with the same name was changed. Make sure you understand how and when to take each.   amphetamine-dextroamphetamine 15 MG 24 hr capsule Commonly known as: ADDERALL XR Take 3 capsules by mouth daily. What changed: Another medication with the same name was changed. Make sure you understand  how and when to take each.   amphetamine-dextroamphetamine 15 MG 24 hr capsule Commonly known as: ADDERALL XR Take 3 capsules by mouth daily. What changed: Another medication with the same name was changed. Make sure you understand how and when to take each.   buPROPion 300 MG 24 hr tablet Commonly known as: WELLBUTRIN XL TAKE 1 TABLET BY MOUTH ONCE DAILY   buPROPion 300 MG 24 hr tablet Commonly known as: WELLBUTRIN XL Take 1 tablet (300 mg total) by mouth daily.   ciprofloxacin 0.3 % ophthalmic solution Commonly known as: Ciloxan Administer 1 drop, every 2 hours,  while awake, for 2 days. Then 1 drop, every 4 hours, while awake, for the next 5 days.   doxycycline 100 MG tablet Commonly known as: VIBRA-TABS Take 1 tablet (100 mg total) by mouth 2 (two) times daily. 1 po bid   ibuprofen 800 MG tablet Commonly known as: ADVIL Take 1 tablet (800 mg total) by mouth every 8 (eight) hours as needed for moderate pain or mild pain.   ketorolac 0.5 % ophthalmic solution Commonly known as: ACULAR Place 1 drop into both eyes every 6 (six) hours.   lidocaine 5 % Commonly known as: LIDODERM Place 1 patch onto the skin daily. Remove & Discard patch within 12 hours or as directed by MD   methocarbamol 500 MG tablet Commonly known as: ROBAXIN Take 2 tablets (1,000 mg total) by mouth every 8 (eight) hours as needed for muscle spasms.   Na Sulfate-K Sulfate-Mg Sulf 17.5-3.13-1.6 GM/177ML Soln Take 1 Bottle by mouth as directed. One kit contains 2 bottles.  Take both bottles at the times instructed by your provider.   oxyCODONE 5 MG immediate release tablet Commonly known as: Oxy IR/ROXICODONE Take 1 tablet (5 mg total) by mouth every 4 (four) hours as needed for moderate pain or severe pain.   Paxlovid (300/100) 20 x 150 MG & 10 x 100MG  Tbpk Generic drug: nirmatrelvir/ritonavir Take 2 (two) pink tablets (300 mg nirmatrelvir) with 1 (one) tablet (100 mg ritonavir) by mouth twice a day for 5 days. All 3 (three) tablets to be taken together every morning and evening.   prednisoLONE acetate 1 % ophthalmic suspension Commonly known as: PRED FORTE Place 1 drop into the left eye 2 (two) times daily.   sildenafil 20 MG tablet Commonly known as: REVATIO TAKE 3-5 TABLETS TWO HOURS PRIOR TO INTERCOURSE What changed:  how much to take how to take this when to take this reasons to take this additional instructions        Allergies:  Allergies  Allergen Reactions   Ambien [Zolpidem] Other (See Comments)    Suicidal thoughts   Orphenadrine Citrate Other  (See Comments)    Blurred vision    Augmentin [Amoxicillin-Pot Clavulanate] Rash    Family History: Family History  Problem Relation Age of Onset   Cancer Unknown    Kidney disease Neg Hx    Prostate cancer Neg Hx     Social History:  reports that he has never smoked. He has never used smokeless tobacco. He reports current alcohol use. He reports that he does not use drugs.  ROS: Pertinent ROS in HPI  Physical Exam: There were no vitals taken for this visit.  Constitutional:  Well nourished. Alert and oriented, No acute distress. HEENT: [place] AT, moist mucus membranes.  Trachea midline, no masses. Cardiovascular: No clubbing, cyanosis, or edema. Respiratory: Normal respiratory effort, no increased work of breathing. GI: Abdomen is soft, non  tender, non distended, no abdominal masses. Liver and spleen not palpable.  No hernias appreciated.  Stool sample for occult testing is not indicated.   GU: No CVA tenderness.  No bladder fullness or masses.  Patient with circumcised/uncircumcised phallus. ***Foreskin easily retracted***  Urethral meatus is patent.  No penile discharge. No penile lesions or rashes. Scrotum without lesions, cysts, rashes and/or edema.  Testicles are located scrotally bilaterally. No masses are appreciated in the testicles. Left and right epididymis are normal. Rectal: Patient with  normal sphincter tone. Anus and perineum without scarring or rashes. No rectal masses are appreciated. Prostate is approximately *** grams, *** nodules are appreciated. Seminal vesicles are normal. Skin: No rashes, bruises or suspicious lesions. Lymph: No cervical or inguinal adenopathy. Neurologic: Grossly intact, no focal deficits, moving all 4 extremities. Psychiatric: Normal mood and affect.  Laboratory Data: Lab Results  Component Value Date   WBC 7.7 04/27/2022   HGB 15.6 04/27/2022   HCT 45.3 04/27/2022   MCV 93.2 04/27/2022   PLT 228 04/27/2022    Lab Results  Component  Value Date   CREATININE 0.91 04/27/2022    No results found for: "PSA"  Lab Results  Component Value Date   TESTOSTERONE 437 04/22/2018    Lab Results  Component Value Date   HGBA1C 5.7 (H) 04/23/2019    Lab Results  Component Value Date   TSH 1.910 04/22/2018    No results found for: "CHOL", "HDL", "CHOLHDL", "VLDL", "LDLCALC"  Lab Results  Component Value Date   AST 20 04/24/2022   Lab Results  Component Value Date   ALT 18 04/24/2022   No components found for: "ALKALINEPHOPHATASE" No components found for: "BILIRUBINTOTAL"  No results found for: "ESTRADIOL"  Urinalysis    Component Value Date/Time   COLORURINE YELLOW 02/28/2022 1305   APPEARANCEUR CLOUDY (A) 02/28/2022 1305   APPEARANCEUR Clear 04/23/2019 0926   LABSPEC >1.030 (H) 02/28/2022 1305   PHURINE 6.0 02/28/2022 1305   GLUCOSEU NEGATIVE 02/28/2022 1305   HGBUR LARGE (A) 02/28/2022 1305   BILIRUBINUR NEGATIVE 02/28/2022 1305   BILIRUBINUR Negative 04/23/2019 0926   KETONESUR NEGATIVE 02/28/2022 1305   PROTEINUR 100 (A) 02/28/2022 1305   NITRITE POSITIVE (A) 02/28/2022 1305   LEUKOCYTESUR LARGE (A) 02/28/2022 1305    I have reviewed the labs.   Pertinent Imaging: @CT @ @ultrasound @ @KUB @ I have independently reviewed the films.    Assessment & Plan:  ***  There are no diagnoses linked to this encounter.  No follow-ups on file.  These notes generated with voice recognition software. I apologize for typographical errors.  Cloretta Ned  [facility] [address]  [address] [place], [place] [address] [phone]

## 2023-10-14 ENCOUNTER — Ambulatory Visit: Payer: Commercial Managed Care - PPO | Admitting: Urology

## 2023-10-14 ENCOUNTER — Encounter: Payer: Self-pay | Admitting: Urology

## 2023-10-14 VITALS — BP 155/80 | HR 73 | Ht 76.0 in | Wt 197.0 lb

## 2023-10-14 DIAGNOSIS — N401 Enlarged prostate with lower urinary tract symptoms: Secondary | ICD-10-CM | POA: Diagnosis not present

## 2023-10-14 DIAGNOSIS — R350 Frequency of micturition: Secondary | ICD-10-CM

## 2023-10-14 LAB — MICROSCOPIC EXAMINATION

## 2023-10-14 LAB — URINALYSIS, COMPLETE
Bilirubin, UA: NEGATIVE
Glucose, UA: NEGATIVE
Ketones, UA: NEGATIVE
Leukocytes,UA: NEGATIVE
Nitrite, UA: NEGATIVE
Protein,UA: NEGATIVE
RBC, UA: NEGATIVE
Specific Gravity, UA: >1.03 — ABNORMAL HIGH (ref 1.005–1.030)
Urobilinogen, Ur: 0.2 mg/dL (ref 0.2–1.0)
pH, UA: 6 (ref 5.0–7.5)

## 2023-10-14 LAB — BLADDER SCAN AMB NON-IMAGING: PVR: 12 WU

## 2023-10-14 MED ORDER — GEMTESA 75 MG PO TABS: 1.0000 | ORAL_TABLET | Freq: Every day | ORAL | Status: DC

## 2023-10-14 NOTE — Progress Notes (Signed)
I,Amy L Pierron,acting as a scribe for Vanna Scotland, MD.,have documented all relevant documentation on the behalf of Vanna Scotland, MD,as directed by  Vanna Scotland, MD while in the presence of Vanna Scotland, MD.  10/14/2023 9:26 AM   Dannielle Karvonen 06-23-1961 [id number]  Referring provider: Mick Sell, MD [address] [place],  [place] [address]  Chief Complaint  Patient presents with   Establish Care   Benign Prostatic Hypertrophy    HPI: 62 year-old male presents today for further evaluation of urinary issues.  His most recent PSA from 05/29/2023 was 0.83. He is not on any BPH medications currently.  He reports over the last 2 years or so he has urinary urgency and frequency. His stream is less strong than when he was younger.   He has been on Flomax before some years ago but discontinued taking it due to retrograde ejaculation.   He mentions he tries to avoid caffeine.   He takes Adderall which gives him side effects of dry mouth, dry eyes, and chronic constipation.   He does have a CT abdomen pelvis from last year after multitrauma.  His prostate is not particularly enlarged but he does have a bladder diverticulum noted.  Results for orders placed or performed in visit on 10/14/23  Bladder Scan (Post Void Residual) in office  Result Value Ref Range   PVR 12.0 WU    IPSS     Row Name 10/14/23 0847         International Prostate Symptom Score   How often have you had the sensation of not emptying your bladder? Not at All     How often have you had to urinate less than every two hours? Less than half the time     How often have you found you stopped and started again several times when you urinated? Less than 1 in 5 times     How often have you found it difficult to postpone urination? More than half the time     How often have you had a weak urinary stream? More than half the time     How often have you had to strain to start urination? Not at  All     How many times did you typically get up at night to urinate? 3 Times     Total IPSS Score 14       Quality of Life due to urinary symptoms   If you were to spend the rest of your life with your urinary condition just the way it is now how would you feel about that? Delighted            Score:  1-7 Mild 8-19 Moderate 20-35 Severe    PMH: Past Medical History:  Diagnosis Date   Anxiety    BPH (benign prostatic hypertrophy)    BXO (balanitis xerotica obliterans)    Depression    ED (erectile dysfunction)    Heartburn    History of kidney stones    Hypogonadism in male     Surgical History: Past Surgical History:  Procedure Laterality Date   CHOLECYSTECTOMY     CORNEAL TRANSPLANT  02/04/2013   HERNIA REPAIR      Home Medications:  Allergies as of 10/14/2023       Reactions   Ambien [zolpidem] Other (See Comments)   Suicidal thoughts   Orphenadrine Citrate Other (See Comments)   Blurred vision    Augmentin [amoxicillin-pot Clavulanate] Rash  Medication List        Accurate as of October 14, 2023  9:26 AM. If you have any questions, ask your nurse or doctor.          acetaminophen 500 MG tablet Commonly known as: TYLENOL Take 1,000 mg by mouth every 6 (six) hours as needed for mild pain or moderate pain.   Adderall XR 15 MG 24 hr capsule Generic drug: amphetamine-dextroamphetamine Take 3 capsules (45 mg total) by mouth every morning for 30 days . What changed:  how much to take when to take this   Adderall XR 15 MG 24 hr capsule Generic drug: amphetamine-dextroamphetamine Take 3 capsules (45 mg total) by mouth every morning for 30 days . What changed: Another medication with the same name was changed. Make sure you understand how and when to take each.   Adderall XR 15 MG 24 hr capsule Generic drug: amphetamine-dextroamphetamine Take 3 capsules (45 mg total) by mouth every morning for 30 days . What changed: Another medication  with the same name was changed. Make sure you understand how and when to take each.   Adderall XR 15 MG 24 hr capsule Generic drug: amphetamine-dextroamphetamine Take 3 capsules (45 mg total) by mouth every morning What changed: Another medication with the same name was changed. Make sure you understand how and when to take each.   Adderall XR 15 MG 24 hr capsule Generic drug: amphetamine-dextroamphetamine Take 3 capsules (45 mg total) by mouth every morning . What changed: Another medication with the same name was changed. Make sure you understand how and when to take each.   Adderall XR 15 MG 24 hr capsule Generic drug: amphetamine-dextroamphetamine Take 3 capsules by mouth daily. What changed: Another medication with the same name was changed. Make sure you understand how and when to take each.   amphetamine-dextroamphetamine 15 MG 24 hr capsule Commonly known as: ADDERALL XR Take 3 capsules (45 mg total) by mouth once daily for 30 days . What changed: Another medication with the same name was changed. Make sure you understand how and when to take each.   amphetamine-dextroamphetamine 15 MG 24 hr capsule Commonly known as: ADDERALL XR Take 3 capsules by mouth daily. What changed: Another medication with the same name was changed. Make sure you understand how and when to take each.   amphetamine-dextroamphetamine 15 MG 24 hr capsule Commonly known as: ADDERALL XR Take 3 capsules by mouth daily. What changed: Another medication with the same name was changed. Make sure you understand how and when to take each.   buPROPion 300 MG 24 hr tablet Commonly known as: WELLBUTRIN XL TAKE 1 TABLET BY MOUTH ONCE DAILY   buPROPion 300 MG 24 hr tablet Commonly known as: WELLBUTRIN XL Take 1 tablet (300 mg total) by mouth daily.   ciprofloxacin 0.3 % ophthalmic solution Commonly known as: Ciloxan Administer 1 drop, every 2 hours, while awake, for 2 days. Then 1 drop, every 4 hours, while  awake, for the next 5 days.   doxycycline 100 MG tablet Commonly known as: VIBRA-TABS Take 1 tablet (100 mg total) by mouth 2 (two) times daily. 1 po bid   Gemtesa 75 MG Tabs Generic drug: Vibegron Take 1 tablet (75 mg total) by mouth daily. Started by: Vanna Scotland   ibuprofen 800 MG tablet Commonly known as: ADVIL Take 1 tablet (800 mg total) by mouth every 8 (eight) hours as needed for moderate pain or mild pain.   ketorolac 0.5 %  ophthalmic solution Commonly known as: ACULAR Place 1 drop into both eyes every 6 (six) hours.   lidocaine 5 % Commonly known as: LIDODERM Place 1 patch onto the skin daily. Remove & Discard patch within 12 hours or as directed by MD   methocarbamol 500 MG tablet Commonly known as: ROBAXIN Take 2 tablets (1,000 mg total) by mouth every 8 (eight) hours as needed for muscle spasms.   Na Sulfate-K Sulfate-Mg Sulf 17.5-3.13-1.6 GM/177ML Soln Take 1 Bottle by mouth as directed. One kit contains 2 bottles.  Take both bottles at the times instructed by your provider.   oxyCODONE 5 MG immediate release tablet Commonly known as: Oxy IR/ROXICODONE Take 1 tablet (5 mg total) by mouth every 4 (four) hours as needed for moderate pain or severe pain.   Paxlovid (300/100) 20 x 150 MG & 10 x 100MG  Tbpk Generic drug: nirmatrelvir/ritonavir Take 2 (two) pink tablets (300 mg nirmatrelvir) with 1 (one) tablet (100 mg ritonavir) by mouth twice a day for 5 days. All 3 (three) tablets to be taken together every morning and evening.   prednisoLONE acetate 1 % ophthalmic suspension Commonly known as: PRED FORTE Place 1 drop into the left eye 2 (two) times daily.   sildenafil 20 MG tablet Commonly known as: REVATIO TAKE 3-5 TABLETS TWO HOURS PRIOR TO INTERCOURSE What changed:  how much to take how to take this when to take this reasons to take this additional instructions        Allergies:  Allergies  Allergen Reactions   Ambien [Zolpidem] Other (See  Comments)    Suicidal thoughts   Orphenadrine Citrate Other (See Comments)    Blurred vision    Augmentin [Amoxicillin-Pot Clavulanate] Rash    Family History: Family History  Problem Relation Age of Onset   Cancer Unknown    Kidney disease Neg Hx    Prostate cancer Neg Hx     Social History:  reports that he has never smoked. He has never used smokeless tobacco. He reports current alcohol use. He reports that he does not use drugs.   Physical Exam: BP (!) 155/80   Pulse 73   Ht 6\' 4"  (1.93 m)   Wt 197 lb (89.4 kg)   BMI 23.98 kg/m   Constitutional:  Alert and oriented, No acute distress. HEENT: [place] AT, moist mucus membranes.  Trachea midline, no masses. Rectal exam deferred in light of very low PSA Neurologic: Grossly intact, no focal deficits, moving all 4 extremities. Psychiatric: Normal mood and affect.   Assessment & Plan:    1. BPH with frequency  - Symptoms are primarily irritative in nature. His urinalysis is negative. He's emptying his bladder well. There's nothing concerning for any underlying pathology. We discussed treating this like BPH versus overactive bladder and he agrees to treat this from an overactive bladder approach. He's concerned about side effects of anticholinergics. Will try a month of Gemtesa 75 mg, and reach out via MyChart to let us know how it goes.   - Deferred rectal exam today, given his very low PSA and absence of obstructive symptoms -Follow-up plan to be based on how he does with the [facility]. -Did not tolerate Flomax in the past due to sexual side effects.  If he does not improve with Leslye Peer, we will consider alternative BPH treatment.  Return if symptoms worsen or fail to improve.   [facility] [address], [address] [place], [place] [address] [phone]

## 2023-11-03 ENCOUNTER — Other Ambulatory Visit: Payer: Self-pay

## 2023-11-04 ENCOUNTER — Other Ambulatory Visit: Payer: Self-pay

## 2023-11-04 MED ORDER — AMPHETAMINE-DEXTROAMPHET ER 15 MG PO CP24
45.0000 mg | ORAL_CAPSULE | Freq: Every day | ORAL | 0 refills | Status: DC
  Filled 2023-11-04: qty 90, 30d supply, fill #0

## 2023-11-16 ENCOUNTER — Encounter: Payer: Self-pay | Admitting: Urology

## 2023-11-18 MED ORDER — GEMTESA 75 MG PO TABS: 75.0000 mg | ORAL_TABLET | Freq: Every day | ORAL | 11 refills | Status: DC

## 2023-11-24 ENCOUNTER — Other Ambulatory Visit: Payer: Self-pay

## 2023-11-24 MED ORDER — GEMTESA 75 MG PO TABS
75.0000 mg | ORAL_TABLET | Freq: Every day | ORAL | 11 refills | Status: DC
  Filled 2023-11-24 – 2023-11-25 (×3): qty 30, 30d supply, fill #0

## 2023-11-24 NOTE — Telephone Encounter (Signed)
His wife came in to office and would like this prescription to be sent to the community pharmacy across the street. They do not use the [facility] or [facility] in [place] anymore.

## 2023-11-24 NOTE — Addendum Note (Signed)
Addended by: Consuella Lose on: 11/24/2023 01:43 PM   Modules accepted: Orders

## 2023-11-24 NOTE — Telephone Encounter (Signed)
Pharmacy corrected in the chart and refill sent to [facility] pharmacy as requested.

## 2023-11-25 ENCOUNTER — Other Ambulatory Visit: Payer: Self-pay

## 2023-11-25 ENCOUNTER — Telehealth: Payer: Self-pay

## 2023-11-25 DIAGNOSIS — N401 Enlarged prostate with lower urinary tract symptoms: Secondary | ICD-10-CM

## 2023-11-25 NOTE — Telephone Encounter (Signed)
Received fax from [facility] pharmacy stating that Dustin Little is a step therapy drug for patient's plan and he has to try Oxybutynin and myrbetriq, unless patient has contradiction with taking this medication or any other reasoning his plan will not cover it at this time.

## 2023-11-26 ENCOUNTER — Other Ambulatory Visit: Payer: Self-pay

## 2023-11-26 MED ORDER — MIRABEGRON ER 50 MG PO TB24
50.0000 mg | ORAL_TABLET | Freq: Every day | ORAL | 11 refills | Status: AC
  Filled 2023-11-26: qty 30, 30d supply, fill #0

## 2023-11-26 NOTE — Telephone Encounter (Signed)
Patient advised and medication sent in.

## 2023-11-26 NOTE — Addendum Note (Signed)
Addended by: Domingo Cocking V on: 11/26/2023 10:00 AM   Modules accepted: Orders

## 2023-11-27 ENCOUNTER — Encounter: Payer: Self-pay | Admitting: Gastroenterology

## 2023-12-02 ENCOUNTER — Other Ambulatory Visit: Payer: Self-pay

## 2023-12-03 ENCOUNTER — Other Ambulatory Visit: Payer: Self-pay

## 2023-12-03 NOTE — H&P (Signed)
Pre-Procedure H&P   Patient ID: Dustin Little is a 63 y.o. male.  Gastroenterology Provider: Jaynie Collins, DO  Referring Provider: Dr. Sampson Goon PCP: Mick Sell, MD  Date: 12/04/2023  HPI Mr. Dustin Little is a 63 y.o. male who presents today for Colonoscopy for Personal history of colon polyps .  History of adenomatous and sessile serrated polyps.  Last underwent EGD and colonoscopy in September 2019 with 1 adenomatous polyp and internal hemorrhoids.  Esophagitis and hiatal hernia were noted on upper endoscopy.  Biopsies negative for H. Pylori  Reports daily bowel movement without melena or hematochezia  Also underwent EGD and colonoscopy in 2007  Hemoglobin 14.6 MCV 95 platelets 209,000 creatinine 1.0  Status post cholecystectomy No family history colon cancer colon polyps   Past Medical History:  Diagnosis Date   ADHD (attention deficit hyperactivity disorder)    Anxiety    BPH (benign prostatic hypertrophy)    BXO (balanitis xerotica obliterans)    Depression    ED (erectile dysfunction)    GERD (gastroesophageal reflux disease)    Heartburn    History of kidney stones    Hypogonadism in male    Pre-diabetes     Past Surgical History:  Procedure Laterality Date   CHOLECYSTECTOMY     COLONOSCOPY     CORNEAL TRANSPLANT  02/04/2013   ESOPHAGOGASTRODUODENOSCOPY     HERNIA REPAIR      Family History No h/o GI disease or malignancy  Review of Systems  Constitutional:  Negative for activity change, appetite change, chills, diaphoresis, fatigue, fever and unexpected weight change.  HENT:  Negative for trouble swallowing and voice change.   Respiratory:  Negative for shortness of breath and wheezing.   Cardiovascular:  Negative for chest pain, palpitations and leg swelling.  Gastrointestinal:  Negative for abdominal distention, abdominal pain, anal bleeding, blood in stool, constipation, diarrhea, nausea and vomiting.  Musculoskeletal:   Negative for arthralgias and myalgias.  Skin:  Negative for color change and pallor.  Neurological:  Negative for dizziness, syncope and weakness.  Psychiatric/Behavioral:  Negative for confusion. The patient is not nervous/anxious.   All other systems reviewed and are negative.    Medications Current Facility-Administered Medications on File Prior to Encounter  Medication Dose Route Frequency Provider Last Rate Last Admin   0.9 %  sodium chloride infusion  250 mL Intravenous PRN Almond Lint, MD       acetaminophen (TYLENOL) tablet 650 mg  650 mg Oral Q4H PRN Almond Lint, MD       docusate sodium (COLACE) capsule 100 mg  100 mg Oral BID Almond Lint, MD       enoxaparin (LOVENOX) injection 30 mg  30 mg Subcutaneous Q12H Almond Lint, MD       melatonin tablet 3 mg  3 mg Oral QHS PRN Almond Lint, MD       methocarbamol (ROBAXIN) tablet 500 mg  500 mg Oral Q8H PRN Almond Lint, MD       Or   methocarbamol (ROBAXIN) 500 mg in dextrose 5 % 50 mL IVPB  500 mg Intravenous Q8H PRN Almond Lint, MD       morphine (PF) 4 MG/ML injection 1-2 mg  1-2 mg Intravenous Q1H PRN Almond Lint, MD       ondansetron (ZOFRAN-ODT) disintegrating tablet 4 mg  4 mg Oral Q6H PRN Almond Lint, MD       Or   ondansetron (ZOFRAN) 4 mg in sodium chloride 0.9 %  50 mL IVPB  4 mg Intravenous Q6H PRN Almond Lint, MD       oxyCODONE (Oxy IR/ROXICODONE) immediate release tablet 5-10 mg  5-10 mg Oral Q4H PRN Almond Lint, MD       prochlorperazine (COMPAZINE) tablet 10 mg  10 mg Oral Q6H PRN Almond Lint, MD       Or   prochlorperazine (COMPAZINE) injection 5-10 mg  5-10 mg Intravenous Q6H PRN Almond Lint, MD       sodium chloride flush (NS) 0.9 % injection 3 mL  3 mL Intravenous Q12H Almond Lint, MD       sodium chloride flush (NS) 0.9 % injection 3 mL  3 mL Intravenous PRN Almond Lint, MD       Current Outpatient Medications on File Prior to Encounter  Medication Sig Dispense Refill    amphetamine-dextroamphetamine (ADDERALL XR) 15 MG 24 hr capsule Take 3 capsules (45 mg total) by mouth every morning for 30 days . 90 capsule 0   buPROPion (WELLBUTRIN XL) 300 MG 24 hr tablet TAKE 1 TABLET BY MOUTH ONCE DAILY 90 tablet 3   ibuprofen (ADVIL) 800 MG tablet Take 1 tablet (800 mg total) by mouth every 8 (eight) hours as needed for moderate pain or mild pain. 30 tablet 0   methocarbamol (ROBAXIN) 500 MG tablet Take 2 tablets (1,000 mg total) by mouth every 8 (eight) hours as needed for muscle spasms. 60 tablet 0   acetaminophen (TYLENOL) 500 MG tablet Take 1,000 mg by mouth every 6 (six) hours as needed for mild pain or moderate pain.     amphetamine-dextroamphetamine (ADDERALL XR) 15 MG 24 hr capsule Take 3 capsules (45 mg total) by mouth every morning for 30 days . (Patient taking differently: Take 45 mg by mouth daily.) 90 capsule 0   amphetamine-dextroamphetamine (ADDERALL XR) 15 MG 24 hr capsule Take 3 capsules (45 mg total) by mouth every morning for 30 days . 90 capsule 0   amphetamine-dextroamphetamine (ADDERALL XR) 15 MG 24 hr capsule Take 3 capsules (45 mg total) by mouth every morning 90 capsule 0   amphetamine-dextroamphetamine (ADDERALL XR) 15 MG 24 hr capsule Take 3 capsules (45 mg total) by mouth every morning . 90 capsule 0   amphetamine-dextroamphetamine (ADDERALL XR) 15 MG 24 hr capsule Take 3 capsules (45 mg total) by mouth once daily for 30 days . 90 capsule 0   amphetamine-dextroamphetamine (ADDERALL XR) 15 MG 24 hr capsule Take 3 capsules by mouth daily. 90 capsule 0   amphetamine-dextroamphetamine (ADDERALL XR) 15 MG 24 hr capsule Take 3 capsules by mouth daily. 90 capsule 0   buPROPion (WELLBUTRIN XL) 300 MG 24 hr tablet Take 1 tablet (300 mg total) by mouth daily. 90 tablet 3   ciprofloxacin (CILOXAN) 0.3 % ophthalmic solution Administer 1 drop, every 2 hours, while awake, for 2 days. Then 1 drop, every 4 hours, while awake, for the next 5 days. 5 mL 0   doxycycline  (VIBRA-TABS) 100 MG tablet Take 1 tablet (100 mg total) by mouth 2 (two) times daily. 1 po bid 20 tablet 0   ketorolac (ACULAR) 0.5 % ophthalmic solution Place 1 drop into both eyes every 6 (six) hours. 5 mL 0   lidocaine (LIDODERM) 5 % Place 1 patch onto the skin daily. Remove & Discard patch within 12 hours or as directed by MD 30 patch 0   Na Sulfate-K Sulfate-Mg Sulf 17.5-3.13-1.6 GM/177ML SOLN Take 1 Bottle by mouth as directed. One kit contains  2 bottles.  Take both bottles at the times instructed by your provider. 354 mL 0   nirmatrelvir & ritonavir (PAXLOVID, 300/100,) 20 x 150 MG & 10 x 100MG  TBPK Take 2 (two) pink tablets (300 mg nirmatrelvir) with 1 (one) tablet (100 mg ritonavir) by mouth twice a day for 5 days. All 3 (three) tablets to be taken together every morning and evening. 30 each 0   oxyCODONE (OXY IR/ROXICODONE) 5 MG immediate release tablet Take 1 tablet (5 mg total) by mouth every 4 (four) hours as needed for moderate pain or severe pain. (Patient not taking: Reported on 12/04/2023) 30 tablet 0   prednisoLONE acetate (PRED FORTE) 1 % ophthalmic suspension Place 1 drop into the left eye 2 (two) times daily. 5 mL 0   sildenafil (REVATIO) 20 MG tablet TAKE 3-5 TABLETS TWO HOURS PRIOR TO INTERCOURSE (Patient taking differently: Take 60-100 mg by mouth daily as needed (erectile dysfunction).) 90 tablet 3    Pertinent medications related to GI and procedure were reviewed by me with the patient prior to the procedure   Current Facility-Administered Medications:    0.9 %  sodium chloride infusion, , Intravenous, Continuous, Jaynie Collins, DO, Last Rate: 20 mL/hr at 12/04/23 7846, Continued from Pre-op at 12/04/23 9629  Facility-Administered Medications Ordered in Other Encounters:    0.9 %  sodium chloride infusion, 250 mL, Intravenous, PRN, Almond Lint, MD   acetaminophen (TYLENOL) tablet 650 mg, 650 mg, Oral, Q4H PRN, Almond Lint, MD   docusate sodium (COLACE) capsule  100 mg, 100 mg, Oral, BID, Almond Lint, MD   enoxaparin (LOVENOX) injection 30 mg, 30 mg, Subcutaneous, Q12H, Almond Lint, MD   melatonin tablet 3 mg, 3 mg, Oral, QHS PRN, Almond Lint, MD   methocarbamol (ROBAXIN) tablet 500 mg, 500 mg, Oral, Q8H PRN **OR** methocarbamol (ROBAXIN) 500 mg in dextrose 5 % 50 mL IVPB, 500 mg, Intravenous, Q8H PRN, Almond Lint, MD   morphine (PF) 4 MG/ML injection 1-2 mg, 1-2 mg, Intravenous, Q1H PRN, Almond Lint, MD   ondansetron (ZOFRAN-ODT) disintegrating tablet 4 mg, 4 mg, Oral, Q6H PRN **OR** ondansetron (ZOFRAN) 4 mg in sodium chloride 0.9 % 50 mL IVPB, 4 mg, Intravenous, Q6H PRN, Almond Lint, MD   oxyCODONE (Oxy IR/ROXICODONE) immediate release tablet 5-10 mg, 5-10 mg, Oral, Q4H PRN, Almond Lint, MD   prochlorperazine (COMPAZINE) tablet 10 mg, 10 mg, Oral, Q6H PRN **OR** prochlorperazine (COMPAZINE) injection 5-10 mg, 5-10 mg, Intravenous, Q6H PRN, Almond Lint, MD   sodium chloride flush (NS) 0.9 % injection 3 mL, 3 mL, Intravenous, Q12H, Almond Lint, MD   sodium chloride flush (NS) 0.9 % injection 3 mL, 3 mL, Intravenous, PRN, Almond Lint, MD  sodium chloride 20 mL/hr at 12/04/23 5284       Allergies  Allergen Reactions   Ambien [Zolpidem] Other (See Comments)    Suicidal thoughts   Orphenadrine Citrate Other (See Comments)    Blurred vision    Augmentin [Amoxicillin-Pot Clavulanate] Rash   Allergies were reviewed by me prior to the procedure  Objective   Body mass index is 22.4 kg/m. Vitals:   12/04/23 0804  BP: 125/82  Pulse: 61  Resp: 16  Temp: (!) 96.2 F (35.7 C)  TempSrc: Temporal  SpO2: 99%  Weight: 83.5 kg     Physical Exam Vitals and nursing note reviewed.  Constitutional:      General: He is not in acute distress.    Appearance: Normal appearance. He is not ill-appearing,  toxic-appearing or diaphoretic.  HENT:     Head: Normocephalic and atraumatic.     Nose: Nose normal.     Mouth/Throat:     Mouth:  Mucous membranes are moist.     Pharynx: Oropharynx is clear.  Eyes:     General: No scleral icterus.    Extraocular Movements: Extraocular movements intact.  Cardiovascular:     Rate and Rhythm: Normal rate and regular rhythm.     Heart sounds: Normal heart sounds. No murmur heard.    No friction rub. No gallop.  Pulmonary:     Effort: Pulmonary effort is normal. No respiratory distress.     Breath sounds: Normal breath sounds. No wheezing, rhonchi or rales.  Abdominal:     General: Bowel sounds are normal. There is no distension.     Palpations: Abdomen is soft.     Tenderness: There is no abdominal tenderness. There is no guarding or rebound.  Musculoskeletal:     Cervical back: Neck supple.     Right lower leg: No edema.     Left lower leg: No edema.  Skin:    General: Skin is warm and dry.     Coloration: Skin is not jaundiced or pale.  Neurological:     General: No focal deficit present.     Mental Status: He is alert and oriented to person, place, and time. Mental status is at baseline.  Psychiatric:        Mood and Affect: Mood normal.        Behavior: Behavior normal.        Thought Content: Thought content normal.        Judgment: Judgment normal.      Assessment:  Mr. Dustin Little is a 63 y.o. male  who presents today for Colonoscopy for Personal history of colon polyps .  Plan:  Colonoscopy with possible intervention today  Colonoscopy with possible biopsy, control of bleeding, polypectomy, and interventions as necessary has been discussed with the patient/patient representative. Informed consent was obtained from the patient/patient representative after explaining the indication, nature, and risks of the procedure including but not limited to death, bleeding, perforation, missed neoplasm/lesions, cardiorespiratory compromise, and reaction to medications. Opportunity for questions was given and appropriate answers were provided. Patient/patient representative has  verbalized understanding is amenable to undergoing the procedure.   Jaynie Collins, DO  [facility] Gastroenterology  Portions of the record may have been created with voice recognition software. Occasional wrong-word or 'sound-a-like' substitutions may have occurred due to the inherent limitations of voice recognition software.  Read the chart carefully and recognize, using context, where substitutions may have occurred.

## 2023-12-04 ENCOUNTER — Encounter: Payer: Self-pay | Admitting: Gastroenterology

## 2023-12-04 ENCOUNTER — Encounter
Admission: RE | Disposition: A | Discharge status: Home / Self Care | Payer: Self-pay | Source: Home / Self Care | Attending: Gastroenterology

## 2023-12-04 ENCOUNTER — Ambulatory Visit: Payer: Commercial Managed Care - PPO | Admitting: Anesthesiology

## 2023-12-04 ENCOUNTER — Ambulatory Visit
Admission: RE | Admit: 2023-12-04 | Discharge: 2023-12-04 | Disposition: A | Discharge status: Home / Self Care | Payer: Commercial Managed Care - PPO | Attending: Gastroenterology | Admitting: Gastroenterology

## 2023-12-04 ENCOUNTER — Other Ambulatory Visit: Payer: Self-pay

## 2023-12-04 DIAGNOSIS — K640 First degree hemorrhoids: Secondary | ICD-10-CM | POA: Insufficient documentation

## 2023-12-04 DIAGNOSIS — Z860100 Personal history of colon polyps, unspecified: Secondary | ICD-10-CM | POA: Diagnosis not present

## 2023-12-04 DIAGNOSIS — K649 Unspecified hemorrhoids: Secondary | ICD-10-CM | POA: Diagnosis not present

## 2023-12-04 DIAGNOSIS — Z1211 Encounter for screening for malignant neoplasm of colon: Secondary | ICD-10-CM | POA: Diagnosis not present

## 2023-12-04 DIAGNOSIS — Z860101 Personal history of adenomatous and serrated colon polyps: Secondary | ICD-10-CM | POA: Diagnosis not present

## 2023-12-04 DIAGNOSIS — Z09 Encounter for follow-up examination after completed treatment for conditions other than malignant neoplasm: Secondary | ICD-10-CM | POA: Diagnosis not present

## 2023-12-04 DIAGNOSIS — K219 Gastro-esophageal reflux disease without esophagitis: Secondary | ICD-10-CM | POA: Insufficient documentation

## 2023-12-04 HISTORY — PX: COLONOSCOPY: SHX5424

## 2023-12-04 HISTORY — DX: Prediabetes: R73.03

## 2023-12-04 HISTORY — DX: Gastro-esophageal reflux disease without esophagitis: K21.9

## 2023-12-04 HISTORY — DX: Attention-deficit hyperactivity disorder, unspecified type: F90.9

## 2023-12-04 SURGERY — COLONOSCOPY
Anesthesia: General

## 2023-12-04 MED ORDER — DEXMEDETOMIDINE HCL IN NACL 80 MCG/20ML IV SOLN
INTRAVENOUS | Status: DC | PRN
  Administered 2023-12-04: 20 ug via INTRAVENOUS

## 2023-12-04 MED ORDER — PROPOFOL 10 MG/ML IV BOLUS
INTRAVENOUS | Status: DC | PRN
  Administered 2023-12-04: 50 mg via INTRAVENOUS

## 2023-12-04 MED ORDER — SODIUM CHLORIDE 0.9 % IV SOLN: INTRAVENOUS | Status: DC

## 2023-12-04 MED ORDER — PROPOFOL 500 MG/50ML IV EMUL
INTRAVENOUS | Status: DC | PRN
  Administered 2023-12-04: 75 ug/kg/min via INTRAVENOUS

## 2023-12-04 MED ORDER — LIDOCAINE HCL (CARDIAC) PF 100 MG/5ML IV SOSY
PREFILLED_SYRINGE | INTRAVENOUS | Status: DC | PRN
  Administered 2023-12-04: 60 mg via INTRAVENOUS

## 2023-12-04 MED ORDER — LIDOCAINE HCL (PF) 2 % IJ SOLN
INTRAMUSCULAR | Status: AC
Start: 2023-12-04 — End: ?
  Filled 2023-12-04: qty 5

## 2023-12-04 NOTE — Anesthesia Postprocedure Evaluation (Signed)
Anesthesia Post Note  Patient: Dustin Little  Procedure(s) Performed: COLONOSCOPY  Patient location during evaluation: PACU Anesthesia Type: General Level of consciousness: awake and alert Pain management: pain level controlled Vital Signs Assessment: post-procedure vital signs reviewed and stable Respiratory status: spontaneous breathing, nonlabored ventilation, respiratory function stable and patient connected to nasal cannula oxygen Cardiovascular status: blood pressure returned to baseline and stable Postop Assessment: no apparent nausea or vomiting Anesthetic complications: no  No notable events documented.   Last Vitals:  Vitals:   12/04/23 0907 12/04/23 0910  BP:  97/63  Pulse: (!) 59 61  Resp: (!) 27 11  Temp: (!) 35.9 C   SpO2: 99% 100%    Last Pain:  Vitals:   12/04/23 1610  TempSrc: Temporal                 Stephanie Coup

## 2023-12-04 NOTE — Transfer of Care (Signed)
Immediate Anesthesia Transfer of Care Note  Patient: Dustin Little  Procedure(s) Performed: COLONOSCOPY  Patient Location: PACU  Anesthesia Type:General  Level of Consciousness: sedated  Airway & Oxygen Therapy: Patient Spontanous Breathing  Post-op Assessment: Report given to RN and Post -op Vital signs reviewed and stable  Post vital signs: Reviewed and stable  Last Vitals:  Vitals Value Taken Time  BP 97/63 12/04/23 0908  Temp 35.9 C 12/04/23 0907  Pulse 61 12/04/23 0908  Resp 18 12/04/23 0908  SpO2 100 % 12/04/23 0908  Vitals shown include unfiled device data.  Last Pain:  Vitals:   12/04/23 0907  TempSrc: Temporal         Complications: No notable events documented.

## 2023-12-04 NOTE — Anesthesia Preprocedure Evaluation (Signed)
Anesthesia Evaluation  Patient identified by MRN, date of birth, ID band Patient awake    Reviewed: Allergy & Precautions, NPO status , Patient's Chart, lab work & pertinent test results  Airway Mallampati: II  TM Distance: >3 FB Neck ROM: full    Dental  (+) Chipped, Dental Advidsory Given   Pulmonary neg pulmonary ROS   Pulmonary exam normal        Cardiovascular negative cardio ROS Normal cardiovascular exam     Neuro/Psych  PSYCHIATRIC DISORDERS Anxiety Depression    negative neurological ROS     GI/Hepatic Neg liver ROS,GERD  Medicated,,  Endo/Other  negative endocrine ROS    Renal/GU negative Renal ROS  negative genitourinary   Musculoskeletal   Abdominal   Peds  Hematology negative hematology ROS (+)   Anesthesia Other Findings Past Medical History: No date: ADHD (attention deficit hyperactivity disorder) No date: Anxiety No date: BPH (benign prostatic hypertrophy) No date: BXO (balanitis xerotica obliterans) No date: Depression No date: ED (erectile dysfunction) No date: GERD (gastroesophageal reflux disease) No date: Heartburn No date: History of kidney stones No date: Hypogonadism in male No date: Pre-diabetes  Past Surgical History: No date: CHOLECYSTECTOMY No date: COLONOSCOPY 02/04/2013: CORNEAL TRANSPLANT No date: ESOPHAGOGASTRODUODENOSCOPY No date: HERNIA REPAIR  BMI    Body Mass Index: 22.40 kg/m      Reproductive/Obstetrics negative OB ROS                             Anesthesia Physical Anesthesia Plan  ASA: 2  Anesthesia Plan: General   Post-op Pain Management: Minimal or no pain anticipated   Induction: Intravenous  PONV Risk Score and Plan: 3 and Propofol infusion, TIVA and Ondansetron  Airway Management Planned: Nasal Cannula  Additional Equipment: None  Intra-op Plan:   Post-operative Plan:   Informed Consent: I have reviewed the  patients History and Physical, chart, labs and discussed the procedure including the risks, benefits and alternatives for the proposed anesthesia with the patient or authorized representative who has indicated his/her understanding and acceptance.     Dental advisory given  Plan Discussed with: CRNA and Surgeon  Anesthesia Plan Comments: (Discussed risks of anesthesia with patient, including possibility of difficulty with spontaneous ventilation under anesthesia necessitating airway intervention, PONV, and rare risks such as cardiac or respiratory or neurological events, and allergic reactions. Discussed the role of CRNA in patient's perioperative care. Patient understands.)       Anesthesia Quick Evaluation

## 2023-12-04 NOTE — Op Note (Signed)
[facility] Gastroenterology Patient Name: Dustin Little Procedure Date: 12/04/2023 8:26 AM MRN: [id number] Account #: [id number] Date of Birth: Jun 05, 1961 Admit Type: Ambulatory Age: 63 Room: [facility] ENDO ROOM 1 Gender: Male Note Status: Finalized Instrument Name: Colonoscope [id number] Procedure:             Colonoscopy Indications:           High risk colon cancer surveillance: Personal history                         of colonic polyps Providers:             Trenda Moots, DO Referring MD:          Clydie Braun (Referring MD) Medicines:             Monitored Anesthesia Care Complications:         No immediate complications. Estimated blood loss: None. Procedure:             Pre-Anesthesia Assessment:                        - Prior to the procedure, a History and Physical was                         performed, and patient medications and allergies were                         reviewed. The patient is competent. The risks and                         benefits of the procedure and the sedation options and                         risks were discussed with the patient. All questions                         were answered and informed consent was obtained.                         Patient identification and proposed procedure were                         verified by the physician, the nurse, the anesthetist                         and the technician in the endoscopy suite. Mental                         Status Examination: alert and oriented. Airway                         Examination: normal oropharyngeal airway and neck                         mobility. Respiratory Examination: clear to                         auscultation. CV Examination: RRR, no murmurs, no S3  or S4. Prophylactic Antibiotics: The patient does not                         require prophylactic antibiotics. Prior                         Anticoagulants: The patient has taken  no anticoagulant                         or antiplatelet agents. ASA Grade Assessment: II - A                         patient with mild systemic disease. After reviewing                         the risks and benefits, the patient was deemed in                         satisfactory condition to undergo the procedure. The                         anesthesia plan was to use monitored anesthesia care                         (MAC). Immediately prior to administration of                         medications, the patient was re-assessed for adequacy                         to receive sedatives. The heart rate, respiratory                         rate, oxygen saturations, blood pressure, adequacy of                         pulmonary ventilation, and response to care were                         monitored throughout the procedure. The physical                         status of the patient was re-assessed after the                         procedure.                        After obtaining informed consent, the colonoscope was                         passed under direct vision. Throughout the procedure,                         the patient's blood pressure, pulse, and oxygen                         saturations were monitored continuously. The  Colonoscope was introduced through the anus and                         advanced to the the terminal ileum, with                         identification of the appendiceal orifice and IC                         valve. The colonoscopy was performed without                         difficulty. The patient tolerated the procedure well.                         The quality of the bowel preparation was evaluated                         using the BBPS [facility] Bowel Preparation Scale) with                         scores of: Right Colon = 3, Transverse Colon = 3 and                         Left Colon = 3 (entire mucosa seen well with no                          residual staining, small fragments of stool or opaque                         liquid). The total BBPS score equals 9. The terminal                         ileum, ileocecal valve, appendiceal orifice, and                         rectum were photographed. Findings:      The perianal and digital rectal examinations were normal. Pertinent       negatives include normal sphincter tone.      The terminal ileum appeared normal. Estimated blood loss: none.      Retroflexion in the right colon was performed.      Non-bleeding internal hemorrhoids were found during retroflexion. The       hemorrhoids were Grade I (internal hemorrhoids that do not prolapse).       Estimated blood loss: none.      The entire examined colon appeared normal on direct and retroflexion       views. Impression:            - The examined portion of the ileum was normal.                        - Non-bleeding internal hemorrhoids.                        - The entire examined colon is normal on direct and  retroflexion views.                        - No specimens collected. Recommendation:        - Patient has a contact number available for                         emergencies. The signs and symptoms of potential                         delayed complications were discussed with the patient.                         Return to normal activities tomorrow. Written                         discharge instructions were provided to the patient.                        - Discharge patient to home.                        - Resume previous diet.                        - Continue present medications.                        - Repeat colonoscopy in 7 years for surveillance.                        - Return to referring physician as previously                         scheduled.                        - The findings and recommendations were discussed with                         the patient. Procedure Code(s):      --- Professional ---                        [phone], Colonoscopy, flexible; diagnostic, including                         collection of specimen(s) by brushing or washing, when                         performed (separate procedure) Diagnosis Code(s):     --- Professional ---                        Z86.010, Personal history of colonic polyps                        K64.0, First degree hemorrhoids CPT copyright 2022 American [employer] Association. All rights reserved. The codes documented in this report are preliminary and upon coder review may  be revised to meet current compliance requirements. Attending Participation:      I personally performed the entire procedure. Elfredia Nevins,  DO Jaynie Collins DO, DO 12/04/2023 9:06:36 AM This report has been signed electronically. Number of Addenda: 0 Note Initiated On: 12/04/2023 8:26 AM Scope Withdrawal Time: 0 hours 11 minutes 48 seconds  Total Procedure Duration: 0 hours 17 minutes 57 seconds  Estimated Blood Loss:  Estimated blood loss: none.      [facility]

## 2023-12-04 NOTE — Interval H&P Note (Signed)
History and Physical Interval Note: Preprocedure H&P from 12/04/23  was reviewed and there was no interval change after seeing and examining the patient.  Written consent was obtained from the patient after discussion of risks, benefits, and alternatives. Patient has consented to proceed with Colonoscopy with possible intervention   12/04/2023 8:39 AM  Dustin Little  has presented today for surgery, with the diagnosis of Personal history of adenomatous and serrated colon polyps (Z86.0101).  The various methods of treatment have been discussed with the patient and family. After consideration of risks, benefits and other options for treatment, the patient has consented to  Procedure(s): COLONOSCOPY (N/A) as a surgical intervention.  The patient's history has been reviewed, patient examined, no change in status, stable for surgery.  I have reviewed the patient's chart and labs.  Questions were answered to the patient's satisfaction.     Jaynie Collins

## 2023-12-05 ENCOUNTER — Other Ambulatory Visit: Payer: Self-pay

## 2023-12-05 MED ORDER — AMPHETAMINE-DEXTROAMPHET ER 15 MG PO CP24
45.0000 mg | ORAL_CAPSULE | Freq: Every day | ORAL | 0 refills | Status: DC
  Filled 2023-12-05: qty 90, 30d supply, fill #0

## 2023-12-08 ENCOUNTER — Encounter: Payer: Self-pay | Admitting: Gastroenterology

## 2024-01-06 ENCOUNTER — Other Ambulatory Visit: Payer: Self-pay

## 2024-01-06 MED ORDER — AMPHETAMINE-DEXTROAMPHET ER 15 MG PO CP24
45.0000 mg | ORAL_CAPSULE | Freq: Every day | ORAL | 0 refills | Status: AC
  Filled 2024-01-06: qty 90, 30d supply, fill #0

## 2024-01-14 ENCOUNTER — Ambulatory Visit
Admission: EM | Admit: 2024-01-14 | Discharge: 2024-01-14 | Disposition: A | Discharge status: Home / Self Care | Attending: Emergency Medicine | Admitting: Emergency Medicine

## 2024-01-14 ENCOUNTER — Ambulatory Visit (INDEPENDENT_AMBULATORY_CARE_PROVIDER_SITE_OTHER)

## 2024-01-14 DIAGNOSIS — R051 Acute cough: Secondary | ICD-10-CM | POA: Diagnosis not present

## 2024-01-14 DIAGNOSIS — J069 Acute upper respiratory infection, unspecified: Secondary | ICD-10-CM

## 2024-01-14 DIAGNOSIS — R058 Other specified cough: Secondary | ICD-10-CM | POA: Diagnosis not present

## 2024-01-14 MED ORDER — AEROCHAMBER MV MISC
2 refills | Status: AC
  Filled 2024-01-14: qty 1, 30d supply, fill #0

## 2024-01-14 MED ORDER — DOXYCYCLINE HYCLATE 100 MG PO CAPS
100.0000 mg | ORAL_CAPSULE | Freq: Two times a day (BID) | ORAL | 0 refills | Status: AC
  Filled 2024-01-14: qty 14, 7d supply, fill #0

## 2024-01-14 MED ORDER — PROMETHAZINE-DM 6.25-15 MG/5ML PO SYRP
5.0000 mL | ORAL_SOLUTION | Freq: Four times a day (QID) | ORAL | 0 refills | Status: AC | PRN
Start: 2024-01-14 — End: ?
  Filled 2024-01-14: qty 118, 6d supply, fill #0

## 2024-01-14 MED ORDER — BENZONATATE 100 MG PO CAPS
200.0000 mg | ORAL_CAPSULE | Freq: Three times a day (TID) | ORAL | 0 refills | Status: AC
  Filled 2024-01-14: qty 21, 4d supply, fill #0

## 2024-01-14 MED ORDER — ALBUTEROL SULFATE HFA 108 (90 BASE) MCG/ACT IN AERS
2.0000 | INHALATION_SPRAY | RESPIRATORY_TRACT | 0 refills | Status: AC | PRN
  Filled 2024-01-14: qty 6.7, 25d supply, fill #0

## 2024-01-14 MED ORDER — IPRATROPIUM BROMIDE 0.06 % NA SOLN
2.0000 | Freq: Four times a day (QID) | NASAL | 12 refills | Status: AC
  Filled 2024-01-14: qty 15, 25d supply, fill #0

## 2024-01-14 NOTE — ED Provider Notes (Signed)
 [facility]    CSN: [id number] Arrival date & time: 01/14/24  1929      History   Chief Complaint Chief Complaint  Patient presents with   Cough    HPI Dustin Little is a 63 y.o. male.   HPI  63 year old male with past medical history significant for ADHD, BPH, anxiety, ED, GERD, depression presents for evaluation of cough and fatigue has been going on for last week.  Until today it had been nonproductive but that he started to cough up brown mucus.  He reports that he has had an elevated temp of 99, runny nose, nasal congestion, shortness breath, and wheezing.  He is also had some diarrhea but he attributes that to the cold medication he has been taking.  He denies any nausea or vomiting.  Past Medical History:  Diagnosis Date   ADHD (attention deficit hyperactivity disorder)    Anxiety    BPH (benign prostatic hypertrophy)    BXO (balanitis xerotica obliterans)    Depression    ED (erectile dysfunction)    GERD (gastroesophageal reflux disease)    Heartburn    History of kidney stones    Hypogonadism in male    Pre-diabetes     Patient Active Problem List   Diagnosis Date Noted   Closed traumatic fracture of ribs of left side with pneumothorax 04/24/2022   ADHD (attention deficit hyperactivity disorder) 04/19/2018   Depression, major, single episode, complete remission (HCC) 05/10/2016   Gastroesophageal reflux disease without esophagitis 05/10/2016   H/O attention deficit hyperactivity disorder 05/10/2016   BPH with obstruction/lower urinary tract symptoms 03/05/2016   Erectile dysfunction 03/05/2016   Corneal scar, left eye 03/26/2013   Keratitis 04/29/2012    Past Surgical History:  Procedure Laterality Date   CHOLECYSTECTOMY     COLONOSCOPY     COLONOSCOPY N/A 12/04/2023   Procedure: COLONOSCOPY;  Surgeon: Jaynie Collins, DO;  Location: [facility] ENDOSCOPY;  Service: Gastroenterology;  Laterality: N/A;   CORNEAL TRANSPLANT  02/04/2013    ESOPHAGOGASTRODUODENOSCOPY     HERNIA REPAIR         Home Medications    Prior to Admission medications   Medication Sig Start Date End Date Taking? Authorizing Provider  albuterol (VENTOLIN HFA) 108 (90 Base) MCG/ACT inhaler Inhale 2 puffs into the lungs every 4 (four) hours as needed. 01/14/24  Yes Becky Augusta, NP  amphetamine-dextroamphetamine (ADDERALL XR) 15 MG 24 hr capsule Take 3 capsules by mouth daily. 01/06/24  Yes   benzonatate (TESSALON) 100 MG capsule Take 2 capsules (200 mg total) by mouth every 8 (eight) hours. 01/14/24  Yes Becky Augusta, NP  buPROPion (WELLBUTRIN XL) 300 MG 24 hr tablet Take 1 tablet (300 mg total) by mouth daily. 05/05/23  Yes   doxycycline (VIBRAMYCIN) 100 MG capsule Take 1 capsule (100 mg total) by mouth 2 (two) times daily for 7 days. 01/14/24 01/21/24 Yes Becky Augusta, NP  ipratropium (ATROVENT) 0.06 % nasal spray Place 2 sprays into both nostrils 4 (four) times daily. 01/14/24  Yes Becky Augusta, NP  promethazine-dextromethorphan (PROMETHAZINE-DM) 6.25-15 MG/5ML syrup Take 5 mLs by mouth 4 (four) times daily as needed. 01/14/24  Yes Becky Augusta, NP  Spacer/Aero-Holding Chambers (AEROCHAMBER MV) inhaler Use as instructed 01/14/24  Yes Becky Augusta, NP  acetaminophen (TYLENOL) 500 MG tablet Take 1,000 mg by mouth every 6 (six) hours as needed for mild pain or moderate pain.    [provider]  amphetamine-dextroamphetamine (ADDERALL XR) 15 MG 24  hr capsule Take 3 capsules (45 mg total) by mouth every morning for 30 days . Patient taking differently: Take 45 mg by mouth daily. 03/08/22     amphetamine-dextroamphetamine (ADDERALL XR) 15 MG 24 hr capsule Take 3 capsules (45 mg total) by mouth every morning for 30 days . 05/20/22     amphetamine-dextroamphetamine (ADDERALL XR) 15 MG 24 hr capsule Take 3 capsules (45 mg total) by mouth every morning for 30 days . 06/24/22     amphetamine-dextroamphetamine (ADDERALL XR) 15 MG 24 hr capsule Take 3 capsules (45 mg  total) by mouth every morning 07/30/22     amphetamine-dextroamphetamine (ADDERALL XR) 15 MG 24 hr capsule Take 3 capsules (45 mg total) by mouth every morning . 10/01/22     amphetamine-dextroamphetamine (ADDERALL XR) 15 MG 24 hr capsule Take 3 capsules (45 mg total) by mouth once daily for 30 days . 11/26/22     amphetamine-dextroamphetamine (ADDERALL XR) 15 MG 24 hr capsule Take 3 capsules by mouth daily. 10/29/22     amphetamine-dextroamphetamine (ADDERALL XR) 15 MG 24 hr capsule Take 3 capsules by mouth daily. 07/27/23     buPROPion (WELLBUTRIN XL) 300 MG 24 hr tablet TAKE 1 TABLET BY MOUTH ONCE DAILY 04/04/22     ciprofloxacin (CILOXAN) 0.3 % ophthalmic solution Administer 1 drop, every 2 hours, while awake, for 2 days. Then 1 drop, every 4 hours, while awake, for the next 5 days. 09/28/22   Shirlee Latch, PA-C  ibuprofen (ADVIL) 800 MG tablet Take 1 tablet (800 mg total) by mouth every 8 (eight) hours as needed for moderate pain or mild pain. 05/02/22   Juliet Rude, PA-C  ketorolac (ACULAR) 0.5 % ophthalmic solution Place 1 drop into both eyes every 6 (six) hours. 09/28/22   Eusebio Friendly B, PA-C  lidocaine (LIDODERM) 5 % Place 1 patch onto the skin daily. Remove & Discard patch within 12 hours or as directed by MD 05/03/22   Juliet Rude, PA-C  methocarbamol (ROBAXIN) 500 MG tablet Take 2 tablets (1,000 mg total) by mouth every 8 (eight) hours as needed for muscle spasms. 05/02/22   Juliet Rude, PA-C  mirabegron ER (MYRBETRIQ) 50 MG TB24 tablet Take 1 tablet (50 mg total) by mouth daily. 11/26/23   Vanna Scotland, MD  Na Sulfate-K Sulfate-Mg Sulf 17.5-3.13-1.6 GM/177ML SOLN Take 1 Bottle by mouth as directed. One kit contains 2 bottles.  Take both bottles at the times instructed by your provider. 09/15/23     nirmatrelvir & ritonavir (PAXLOVID, 300/100,) 20 x 150 MG & 10 x 100MG  TBPK Take 2 (two) pink tablets (300 mg nirmatrelvir) with 1 (one) tablet (100 mg ritonavir) by mouth twice a  day for 5 days. All 3 (three) tablets to be taken together every morning and evening. 06/09/23     oxyCODONE (OXY IR/ROXICODONE) 5 MG immediate release tablet Take 1 tablet (5 mg total) by mouth every 4 (four) hours as needed for moderate pain or severe pain. Patient not taking: Reported on 12/04/2023 05/02/22   Juliet Rude, PA-C  prednisoLONE acetate (PRED FORTE) 1 % ophthalmic suspension Place 1 drop into the left eye 2 (two) times daily. 02/28/23     sildenafil (REVATIO) 20 MG tablet TAKE 3-5 TABLETS TWO HOURS PRIOR TO INTERCOURSE Patient taking differently: Take 60-100 mg by mouth daily as needed (erectile dysfunction). 01/17/20   Harle Battiest, PA-C    Family History Family History  Problem Relation Age of Onset  Cancer Unknown    Kidney disease Neg Hx    Prostate cancer Neg Hx     Social History Social History   Tobacco Use   Smoking status: Never   Smokeless tobacco: Never  Vaping Use   Vaping status: Never Used  Substance Use Topics   Alcohol use: Not Currently    Comment: occ   Drug use: No    Comment: THCA     Allergies   Ambien [zolpidem], Orphenadrine citrate, and Augmentin [amoxicillin-pot clavulanate]   Review of Systems Review of Systems  Constitutional:  Positive for fever.  HENT:  Positive for congestion and rhinorrhea. Negative for ear pain.   Respiratory:  Positive for cough, shortness of breath and wheezing.   Gastrointestinal:  Positive for diarrhea. Negative for nausea and vomiting.  Skin:  Negative for rash.     Physical Exam Triage Vital Signs ED Triage Vitals [01/14/24 2012]  Encounter Vitals Group     BP 128/71     Systolic BP Percentile      Diastolic BP Percentile      Pulse Rate 77     Resp 16     Temp 98.7 F (37.1 C)     Temp Source Oral     SpO2 95 %     Weight      Height      Head Circumference      Peak Flow      Pain Score      Pain Loc      Pain Education      Exclude from Growth Chart    No data  found.  Updated Vital Signs BP 128/71 (BP Location: Right Arm)   Pulse 77   Temp 98.7 F (37.1 C) (Oral)   Resp 16   SpO2 95%   Visual Acuity Right Eye Distance:   Left Eye Distance:   Bilateral Distance:    Right Eye Near:   Left Eye Near:    Bilateral Near:     Physical Exam Vitals and nursing note reviewed.  Constitutional:      Appearance: Normal appearance. He is not ill-appearing.  HENT:     Head: Normocephalic and atraumatic.     Right Ear: Tympanic membrane, ear canal and external ear normal. There is no impacted cerumen.     Left Ear: Tympanic membrane, ear canal and external ear normal. There is no impacted cerumen.     Nose: Congestion and rhinorrhea present.     Comments: Nasal mucosa is edematous and erythematous with yellow discharge in both nares.    Mouth/Throat:     Mouth: Mucous membranes are moist.     Pharynx: Oropharynx is clear. No oropharyngeal exudate or posterior oropharyngeal erythema.  Cardiovascular:     Rate and Rhythm: Normal rate and regular rhythm.     Pulses: Normal pulses.     Heart sounds: Normal heart sounds. No murmur heard.    No friction rub. No gallop.  Pulmonary:     Effort: Pulmonary effort is normal.     Breath sounds: Normal breath sounds. No wheezing, rhonchi or rales.  Musculoskeletal:     Cervical back: Normal range of motion and neck supple. No tenderness.  Lymphadenopathy:     Cervical: No cervical adenopathy.  Skin:    General: Skin is warm and dry.     Capillary Refill: Capillary refill takes less than 2 seconds.     Findings: No rash.  Neurological:  General: No focal deficit present.     Mental Status: He is alert and oriented to person, place, and time.      UC Treatments / Results  Labs (all labs ordered are listed, but only abnormal results are displayed) Labs Reviewed - No data to display  EKG   Radiology No results found.  Procedures Procedures (including critical care time)  Medications  Ordered in UC Medications - No data to display  Initial Impression / Assessment and Plan / UC Course  I have reviewed the triage vital signs and the nursing notes.  Pertinent labs & imaging results that were available during my care of the patient were reviewed by me and considered in my medical decision making (see chart for details).   Patient is a nontoxic-appearing 63 year old male presenting for evaluation of respiratory symptoms as outlined HPI above.  His most significant concern is that his cough turned productive today and he reports that he is coughing up a brown sputum.  He also endorses is shortness breath and wheezing.  In the exam room he is able to speak in full sentence but dyspnea or tachypnea.  Room air oxygen saturation is 95% and his respiratory rate at triage was 16.  Oral temperature is 98.7.  Examination of the patient upper respiratory tract does reveal inflamed nasal mucosa with thick yellow discharge in both nares.  Oropharyngeal exam is benign.  No cervical adenopathy present on exam.  I will order a chest x-ray to evaluate for any acute cardiopulmonary pathology.  Given that he has had symptoms for the last week he is outside the therapeutic window for antivirals for COVID or influenza so I will not test him for either at this time.  Chest x-ray independent reviewed and evaluated by me.  Impression: Lung fields are well aerated without evidence of infiltrate or effusion.  Cardiomediastinal silhouette appears normal.  Radiology overread is pending. Radiology impression states no active cardiopulmonary disease.  I will discharge patient with a diagnosis of URI with cough and congestion and start him on doxycycline 100 mg twice daily for 7 days for treatment of his upper respiratory infection.  Atrovent nasal spray for the nasal congestion and postnasal drip.  Tessalon Perles and Promethazine DM cough syrup for cough and congestion.  Additionally, I will prescribe an albuterol  inhaler and spacer and he can use 1 to 2 puffs every 4 6 hours as needed for any shortness of breath or wheezing.    Final Clinical Impressions(s) / UC Diagnoses   Final diagnoses:  Acute cough  URI with cough and congestion     Discharge Instructions      Your chest x-ray did not demonstrate any evidence of pneumonia, though your physical exam does reveal that you have an upper respiratory infection.  Because your symptoms appear to be worsening I do feel a trial of antibiotics is warranted.  Take the doxycycline 100 mg twice daily with food for 7 days for treatment of your upper respiratory infection.  Use the albuterol inhaler, with the spacer, 1 to 2 puffs every 4-6 hours, as needed for shortness of breath or wheezing.  Use the Atrovent nasal spray, 2 squirts in each nostril every 6 hours, as needed for runny nose and postnasal drip.  Use the Tessalon Perles every 8 hours during the day.  Take them with a small sip of water.  They may give you some numbness to the base of your tongue or a metallic taste in your  mouth, this is normal.  Use the Promethazine DM cough syrup at bedtime for cough and congestion.  It will make you drowsy so do not take it during the day.  Return for reevaluation or see your primary care provider for any new or worsening symptoms.      ED Prescriptions     Medication Sig Dispense Auth. Provider   Spacer/Aero-Holding Chambers (AEROCHAMBER MV) inhaler Use as instructed 1 each Becky Augusta, NP   albuterol (VENTOLIN HFA) 108 (90 Base) MCG/ACT inhaler Inhale 2 puffs into the lungs every 4 (four) hours as needed. 18 g Becky Augusta, NP   benzonatate (TESSALON) 100 MG capsule Take 2 capsules (200 mg total) by mouth every 8 (eight) hours. 21 capsule Becky Augusta, NP   doxycycline (VIBRAMYCIN) 100 MG capsule Take 1 capsule (100 mg total) by mouth 2 (two) times daily for 7 days. 14 capsule Becky Augusta, NP   ipratropium (ATROVENT) 0.06 % nasal spray Place 2  sprays into both nostrils 4 (four) times daily. 15 mL Becky Augusta, NP   promethazine-dextromethorphan (PROMETHAZINE-DM) 6.25-15 MG/5ML syrup Take 5 mLs by mouth 4 (four) times daily as needed. 118 mL Becky Augusta, NP      PDMP not reviewed this encounter.   Becky Augusta, NP 01/14/24 2030    Becky Augusta, NP 01/15/24 [id number]

## 2024-01-14 NOTE — ED Triage Notes (Signed)
 Cough, fatigue x 1 week.  Pt states he was coughing up brown mucus today.   Taking dyquil and ibuprofen.  Pt took a home covid/ flu test yesterday and it was negative.

## 2024-01-14 NOTE — Discharge Instructions (Addendum)
 Your chest x-ray did not demonstrate any evidence of pneumonia, though your physical exam does reveal that you have an upper respiratory infection.  Because your symptoms appear to be worsening I do feel a trial of antibiotics is warranted.  Take the doxycycline 100 mg twice daily with food for 7 days for treatment of your upper respiratory infection.  Use the albuterol inhaler, with the spacer, 1 to 2 puffs every 4-6 hours, as needed for shortness of breath or wheezing.  Use the Atrovent nasal spray, 2 squirts in each nostril every 6 hours, as needed for runny nose and postnasal drip.  Use the Tessalon Perles every 8 hours during the day.  Take them with a small sip of water.  They may give you some numbness to the base of your tongue or a metallic taste in your mouth, this is normal.  Use the Promethazine DM cough syrup at bedtime for cough and congestion.  It will make you drowsy so do not take it during the day.  Return for reevaluation or see your primary care provider for any new or worsening symptoms.

## 2024-01-15 ENCOUNTER — Other Ambulatory Visit: Payer: Self-pay

## 2024-02-10 ENCOUNTER — Other Ambulatory Visit: Payer: Self-pay

## 2024-02-10 DIAGNOSIS — F909 Attention-deficit hyperactivity disorder, unspecified type: Secondary | ICD-10-CM | POA: Diagnosis not present

## 2024-02-10 DIAGNOSIS — F325 Major depressive disorder, single episode, in full remission: Secondary | ICD-10-CM | POA: Diagnosis not present

## 2024-02-10 DIAGNOSIS — R7303 Prediabetes: Secondary | ICD-10-CM | POA: Diagnosis not present

## 2024-02-10 MED ORDER — AMPHETAMINE-DEXTROAMPHET ER 15 MG PO CP24
45.0000 mg | ORAL_CAPSULE | Freq: Every day | ORAL | 0 refills | Status: DC
  Filled 2024-04-09: qty 90, 30d supply, fill #0

## 2024-02-10 MED ORDER — AMPHETAMINE-DEXTROAMPHET ER 15 MG PO CP24
45.0000 mg | ORAL_CAPSULE | Freq: Every day | ORAL | 0 refills | Status: AC
  Filled 2024-02-10: qty 90, 30d supply, fill #0

## 2024-02-10 MED ORDER — AMPHETAMINE-DEXTROAMPHET ER 15 MG PO CP24
45.0000 mg | ORAL_CAPSULE | Freq: Every day | ORAL | 0 refills | Status: AC
Start: 2024-03-10 — End: ?
  Filled 2024-03-10: qty 90, 30d supply, fill #0

## 2024-03-09 ENCOUNTER — Other Ambulatory Visit: Payer: Self-pay

## 2024-03-10 ENCOUNTER — Other Ambulatory Visit: Payer: Self-pay

## 2024-04-08 ENCOUNTER — Other Ambulatory Visit: Payer: Self-pay

## 2024-04-09 ENCOUNTER — Other Ambulatory Visit: Payer: Self-pay

## 2024-05-10 ENCOUNTER — Other Ambulatory Visit: Payer: Self-pay

## 2024-05-10 MED ORDER — AMPHETAMINE-DEXTROAMPHET ER 15 MG PO CP24
45.0000 mg | ORAL_CAPSULE | Freq: Every day | ORAL | 0 refills | Status: DC
  Filled 2024-05-10: qty 90, 30d supply, fill #0

## 2024-05-10 MED ORDER — BUPROPION HCL ER (XL) 300 MG PO TB24
300.0000 mg | ORAL_TABLET | Freq: Every day | ORAL | 3 refills | Status: AC
  Filled 2024-05-10: qty 90, 90d supply, fill #0
  Filled 2024-08-03: qty 90, 90d supply, fill #1
  Filled 2024-11-08 – 2024-11-15 (×2): qty 90, 90d supply, fill #0

## 2024-05-12 ENCOUNTER — Other Ambulatory Visit: Payer: Self-pay

## 2024-05-12 MED ORDER — AMPHETAMINE-DEXTROAMPHET ER 15 MG PO CP24
45.0000 mg | ORAL_CAPSULE | Freq: Every day | ORAL | 0 refills | Status: DC
  Filled 2024-06-09: qty 60, 20d supply, fill #0
  Filled 2024-06-09: qty 30, 10d supply, fill #0

## 2024-05-18 ENCOUNTER — Other Ambulatory Visit: Payer: Self-pay

## 2024-06-09 ENCOUNTER — Other Ambulatory Visit: Payer: Self-pay

## 2024-07-09 ENCOUNTER — Other Ambulatory Visit: Payer: Self-pay

## 2024-07-09 MED ORDER — AMPHETAMINE-DEXTROAMPHET ER 15 MG PO CP24
45.0000 mg | ORAL_CAPSULE | Freq: Every day | ORAL | 0 refills | Status: DC
  Filled 2024-07-09: qty 10, 3d supply, fill #0
  Filled 2024-07-09: qty 80, 27d supply, fill #0

## 2024-08-03 DIAGNOSIS — F325 Major depressive disorder, single episode, in full remission: Secondary | ICD-10-CM | POA: Diagnosis not present

## 2024-08-03 DIAGNOSIS — Z125 Encounter for screening for malignant neoplasm of prostate: Secondary | ICD-10-CM | POA: Diagnosis not present

## 2024-08-03 DIAGNOSIS — R7303 Prediabetes: Secondary | ICD-10-CM | POA: Diagnosis not present

## 2024-08-03 DIAGNOSIS — F909 Attention-deficit hyperactivity disorder, unspecified type: Secondary | ICD-10-CM | POA: Diagnosis not present

## 2024-08-09 ENCOUNTER — Other Ambulatory Visit: Payer: Self-pay

## 2024-08-10 ENCOUNTER — Other Ambulatory Visit: Payer: Self-pay

## 2024-08-10 MED ORDER — AMPHETAMINE-DEXTROAMPHET ER 15 MG PO CP24
45.0000 mg | ORAL_CAPSULE | Freq: Every day | ORAL | 0 refills | Status: DC
  Filled 2024-08-10: qty 90, 30d supply, fill #0

## 2024-08-25 ENCOUNTER — Other Ambulatory Visit: Payer: Self-pay

## 2024-08-25 DIAGNOSIS — Z0000 Encounter for general adult medical examination without abnormal findings: Secondary | ICD-10-CM | POA: Diagnosis not present

## 2024-08-25 DIAGNOSIS — N401 Enlarged prostate with lower urinary tract symptoms: Secondary | ICD-10-CM | POA: Diagnosis not present

## 2024-08-25 DIAGNOSIS — Z1331 Encounter for screening for depression: Secondary | ICD-10-CM | POA: Diagnosis not present

## 2024-08-25 DIAGNOSIS — Z23 Encounter for immunization: Secondary | ICD-10-CM | POA: Diagnosis not present

## 2024-08-25 DIAGNOSIS — R7303 Prediabetes: Secondary | ICD-10-CM | POA: Diagnosis not present

## 2024-08-25 DIAGNOSIS — F909 Attention-deficit hyperactivity disorder, unspecified type: Secondary | ICD-10-CM | POA: Diagnosis not present

## 2024-08-25 MED ORDER — AMPHETAMINE-DEXTROAMPHET ER 15 MG PO CP24
45.0000 mg | ORAL_CAPSULE | Freq: Every day | ORAL | 0 refills | Status: DC
  Filled 2024-11-08: qty 90, 30d supply, fill #0

## 2024-08-25 MED ORDER — AMPHETAMINE-DEXTROAMPHET ER 15 MG PO CP24
45.0000 mg | ORAL_CAPSULE | Freq: Every day | ORAL | 0 refills | Status: AC
  Filled 2024-10-08: qty 90, 30d supply, fill #0

## 2024-08-25 MED ORDER — AMPHETAMINE-DEXTROAMPHET ER 15 MG PO CP24
45.0000 mg | ORAL_CAPSULE | Freq: Every day | ORAL | 0 refills | Status: AC
  Filled 2024-09-09: qty 90, 30d supply, fill #0

## 2024-09-09 ENCOUNTER — Other Ambulatory Visit: Payer: Self-pay

## 2024-09-23 ENCOUNTER — Ambulatory Visit: Payer: Commercial Managed Care - PPO | Admitting: Dermatology

## 2024-09-23 DIAGNOSIS — L821 Other seborrheic keratosis: Secondary | ICD-10-CM | POA: Diagnosis not present

## 2024-09-23 DIAGNOSIS — Z872 Personal history of diseases of the skin and subcutaneous tissue: Secondary | ICD-10-CM | POA: Diagnosis not present

## 2024-09-23 DIAGNOSIS — L570 Actinic keratosis: Secondary | ICD-10-CM | POA: Diagnosis not present

## 2024-09-23 DIAGNOSIS — L814 Other melanin hyperpigmentation: Secondary | ICD-10-CM | POA: Diagnosis not present

## 2024-09-23 DIAGNOSIS — L578 Other skin changes due to chronic exposure to nonionizing radiation: Secondary | ICD-10-CM | POA: Diagnosis not present

## 2024-09-23 DIAGNOSIS — Z1283 Encounter for screening for malignant neoplasm of skin: Secondary | ICD-10-CM | POA: Diagnosis not present

## 2024-09-23 DIAGNOSIS — L819 Disorder of pigmentation, unspecified: Secondary | ICD-10-CM

## 2024-09-23 DIAGNOSIS — W908XXA Exposure to other nonionizing radiation, initial encounter: Secondary | ICD-10-CM | POA: Diagnosis not present

## 2024-09-23 DIAGNOSIS — L720 Epidermal cyst: Secondary | ICD-10-CM

## 2024-09-23 DIAGNOSIS — N480 Leukoplakia of penis: Secondary | ICD-10-CM

## 2024-09-23 DIAGNOSIS — D1801 Hemangioma of skin and subcutaneous tissue: Secondary | ICD-10-CM | POA: Diagnosis not present

## 2024-09-23 DIAGNOSIS — Z808 Family history of malignant neoplasm of other organs or systems: Secondary | ICD-10-CM | POA: Diagnosis not present

## 2024-09-23 DIAGNOSIS — D229 Melanocytic nevi, unspecified: Secondary | ICD-10-CM

## 2024-09-23 DIAGNOSIS — Z7189 Other specified counseling: Secondary | ICD-10-CM

## 2024-09-23 NOTE — Progress Notes (Signed)
 Follow-Up Visit   Subjective  Dustin Little is a 63 y.o. male who presents for the following: Skin Cancer Screening and Full Body Skin Exam. Spots at face and hands. HxAKs. Pt has a hx of balanitis xerotica obliterans.   The patient presents for Total-Body Skin Exam (TBSE) for skin cancer screening and mole check. The patient has spots, moles and lesions to be evaluated, some may be new or changing and the patient may have concern these could be cancer.  The following portions of the chart were reviewed this encounter and updated as appropriate: medications, allergies, medical history  Review of Systems:  No other skin or systemic complaints except as noted in HPI or Assessment and Plan.  Objective  Well appearing patient in no apparent distress; mood and affect are within normal limits.  A full examination was performed including scalp, head, eyes, ears, nose, lips, neck, chest, axillae, abdomen, back, buttocks, bilateral upper extremities, bilateral lower extremities, hands, feet, fingers, toes, fingernails, and toenails. All findings within normal limits unless otherwise noted below.   Relevant physical exam findings are noted in the Assessment and Plan.  Right temple x1 Pink scaly macules  Assessment & Plan   SKIN CANCER SCREENING PERFORMED TODAY.  ACTINIC DAMAGE - Chronic condition, secondary to cumulative UV/sun exposure - diffuse scaly erythematous macules with underlying dyspigmentation - Recommend daily broad spectrum sunscreen SPF 30+ to sun-exposed areas, reapply every 2 hours as needed.  - Staying in the shade or wearing long sleeves, sun glasses (UVA+UVB protection) and wide brim hats (4-inch brim around the entire circumference of the hat) are also recommended for sun protection.  - Call for new or changing lesions.  LENTIGINES, SEBORRHEIC KERATOSES, HEMANGIOMAS - Benign normal skin lesions - Benign-appearing - Call for any changes  MELANOCYTIC NEVI - Tan-brown  and/or pink-flesh-colored symmetric macules and papules - Benign appearing on exam today - Observation - Call clinic for new or changing moles - Recommend daily use of broad spectrum spf 30+ sunscreen to sun-exposed areas.   Family history of melanoma - patient's sister passed away at 50 years old - Recommend regular full body skin exams - Recommend daily broad spectrum sunscreen SPF 30+ to sun-exposed areas, reapply every 2 hours as needed.  - Call if any new or changing lesions are noted between office visits   Milia - tiny firm white papules - type of cyst - benign - sometimes these will clear with nightly OTC adapalene/Differin 0.1% gel or retinol. - may be extracted if symptomatic - observe  History of Balanitis xerotica obliterans Currently in remission but with postinflammatory dyspigmentation Penis- see photos.  Compared to previous photos to today exam.  There does not seem to be any difference in the discoloration/dyschromia.   Spotty hyperpigmentation appears same today as in previous photos. On exam no active inflammation.  Benign-appearing.  Observation.  Call clinic for new or changing lesions.  Recommend daily use of broad spectrum spf 30+ sunscreen to sun-exposed areas.   AK (ACTINIC KERATOSIS) Right temple x1 Actinic keratoses are precancerous spots that appear secondary to cumulative UV radiation exposure/sun exposure over time. They are chronic with expected duration over 1 year. A portion of actinic keratoses will progress to squamous cell carcinoma of the skin. It is not possible to reliably predict which spots will progress to skin cancer and so treatment is recommended to prevent development of skin cancer.  Recommend daily broad spectrum sunscreen SPF 30+ to sun-exposed areas, reapply every 2 hours  as needed.  Recommend staying in the shade or wearing long sleeves, sun glasses (UVA+UVB protection) and wide brim hats (4-inch brim around the entire circumference of  the hat). Call for new or changing lesions. Destruction of lesion - Right temple x1 Complexity: simple   Destruction method: cryotherapy   Informed consent: discussed and consent obtained   Timeout:  patient name, date of birth, surgical site, and procedure verified Lesion destroyed using liquid nitrogen: Yes   Region frozen until ice ball extended beyond lesion: Yes   Outcome: patient tolerated procedure well with no complications   Post-procedure details: wound care instructions given    SKIN CANCER SCREENING   ACTINIC SKIN DAMAGE   FAMILY HISTORY OF SKIN CANCER   MILIA   LENTIGO   MELANOCYTIC NEVUS, UNSPECIFIED LOCATION   BXO (BALANITIS XEROTICA OBLITERANS)   DYSCHROMIA   COUNSELING AND COORDINATION OF CARE    Return in about 1 year (around 09/23/2025) for TBSE, Fhx MM, Hx AK, w/ Dr. Hester.  I, Jacquelynn V. Wilfred, CMA, am acting as scribe for Alm Hester, MD.  Documentation: I have reviewed the above documentation for accuracy and completeness, and I agree with the above.  Alm Hester, MD

## 2024-09-23 NOTE — Patient Instructions (Addendum)

## 2024-09-27 ENCOUNTER — Encounter: Payer: Self-pay | Admitting: Dermatology

## 2024-10-08 ENCOUNTER — Other Ambulatory Visit: Payer: Self-pay

## 2024-10-10 ENCOUNTER — Other Ambulatory Visit: Payer: Self-pay

## 2024-11-08 ENCOUNTER — Other Ambulatory Visit (HOSPITAL_COMMUNITY): Payer: Self-pay

## 2024-11-08 ENCOUNTER — Other Ambulatory Visit: Payer: Self-pay

## 2024-11-09 ENCOUNTER — Other Ambulatory Visit: Payer: Self-pay

## 2024-11-09 ENCOUNTER — Encounter: Payer: Self-pay | Admitting: Pharmacist

## 2024-11-12 ENCOUNTER — Other Ambulatory Visit: Payer: Self-pay

## 2024-11-15 ENCOUNTER — Other Ambulatory Visit: Payer: Self-pay

## 2024-12-09 ENCOUNTER — Other Ambulatory Visit: Payer: Self-pay

## 2024-12-10 ENCOUNTER — Other Ambulatory Visit: Payer: Self-pay

## 2024-12-10 MED ORDER — AMPHETAMINE-DEXTROAMPHET ER 15 MG PO CP24
ORAL_CAPSULE | Freq: Every day | ORAL | 0 refills | Status: AC
  Filled 2024-12-10: qty 90, 30d supply, fill #0

## 2025-09-28 ENCOUNTER — Ambulatory Visit: Admitting: Dermatology
